# Patient Record
Sex: Male | Born: 2000 | Race: White | Hispanic: Yes | Marital: Single | Smoking: Never smoker
Health system: Southern US, Community
[De-identification: ages and names within clinical notes are randomized; demographics above are authoritative.]

## PROBLEM LIST (undated history)

## (undated) ENCOUNTER — Ambulatory Visit (HOSPITAL_COMMUNITY): Admission: EM | Source: Home / Self Care

## (undated) DIAGNOSIS — J45909 Unspecified asthma, uncomplicated: Secondary | ICD-10-CM

## (undated) HISTORY — PX: HAND SURGERY: SHX662

---
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
## 2020-05-09 ENCOUNTER — Other Ambulatory Visit: Payer: Self-pay

## 2020-05-09 ENCOUNTER — Ambulatory Visit (HOSPITAL_COMMUNITY)
Admission: EM | Admit: 2020-05-09 | Discharge: 2020-05-09 | Disposition: A | Discharge status: Home / Self Care | Payer: Self-pay | Attending: Internal Medicine | Admitting: Internal Medicine

## 2020-05-09 ENCOUNTER — Encounter (HOSPITAL_COMMUNITY): Payer: Self-pay | Admitting: *Deleted

## 2020-05-09 DIAGNOSIS — L02413 Cutaneous abscess of right upper limb: Secondary | ICD-10-CM

## 2020-05-09 MED ORDER — IBUPROFEN 800 MG PO TABS: 800.0000 mg | ORAL_TABLET | Freq: Three times a day (TID) | ORAL | 0 refills | Status: DC

## 2020-05-09 MED ORDER — DOXYCYCLINE HYCLATE 100 MG PO CAPS: 100.0000 mg | ORAL_CAPSULE | Freq: Two times a day (BID) | ORAL | 0 refills | Status: AC

## 2020-05-09 NOTE — ED Triage Notes (Addendum)
Patient reports noticed bump to right upper outer arm that started 5 days ago. About 4 days ago started to become red and grow, today large amount of drainage noted after shower. Patient has been squeezing the area.   Patient also has a small abscess to the right lower arm that he was able to drain on own, no signs of infection noted.   Patient also with abscess he states to right anterior thigh, states that the area if very small like the one on his forearm. That was noted yesterday.   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

## 2020-05-09 NOTE — ED Provider Notes (Signed)
[facility]    CSN: [id number] Arrival date & time: 05/09/20  1625      History   Chief Complaint Chief Complaint  Patient presents with  . Abscess    HPI Casey David is a 19 y.o. male presenting today for evaluation of abscess.  Patient reports he began to develop a small bump to his right upper arm approximately 5 days ago.  He attempted to pop it as he believed it to be a pimple and since has developed increased redness pain and swelling as well as pustular drainage from this area.  He has developed a smaller similar lesion to his right forearm as well as right thigh.  Denies fevers.  HPI  Past Medical History:  Diagnosis Date  . Asthma     There are no problems to display for this patient.   History reviewed. No pertinent surgical history.     Home Medications    Prior to Admission medications   Medication Sig Start Date End Date Taking? Authorizing Provider  doxycycline (VIBRAMYCIN) 100 MG capsule Take 1 capsule (100 mg total) by mouth 2 (two) times daily for 10 days. 05/09/20 05/19/20  Nisaiah Bechtol C, PA-C  ibuprofen (ADVIL) 800 MG tablet Take 1 tablet (800 mg total) by mouth 3 (three) times daily. 05/09/20   Eryk Beavers, Junius Creamer, PA-C    Family History History reviewed. No pertinent family history.  Social History Social History   Tobacco Use  . Smoking status: Never Smoker  . Smokeless tobacco: Never Used  Substance Use Topics  . Alcohol use: Never  . Drug use: Never     Allergies   Patient has no known allergies.   Review of Systems Review of Systems  Constitutional: Negative for fatigue and fever.  Eyes: Negative for redness, itching and visual disturbance.  Respiratory: Negative for shortness of breath.   Cardiovascular: Negative for chest pain and leg swelling.  Gastrointestinal: Negative for nausea and vomiting.  Musculoskeletal: Negative for arthralgias and myalgias.  Skin: Positive for color change and wound. Negative for rash.   Neurological: Negative for dizziness, syncope, weakness, light-headedness and headaches.     Physical Exam Triage Vital Signs ED Triage Vitals  Enc Vitals Group     BP      Pulse      Resp      Temp      Temp src      SpO2      Weight      Height      Head Circumference      Peak Flow      Pain Score      Pain Loc      Pain Edu?      Excl. in GC?    No data found.  Updated Vital Signs BP 118/78 (BP Location: Left Arm)   Pulse 74   Temp 98.4 F (36.9 C) (Oral)   Resp 16   SpO2 100%   Visual Acuity Right Eye Distance:   Left Eye Distance:   Bilateral Distance:    Right Eye Near:   Left Eye Near:    Bilateral Near:     Physical Exam Vitals and nursing note reviewed.  Constitutional:      Appearance: He is well-developed.     Comments: No acute distress  HENT:     Head: Normocephalic and atraumatic.     Nose: Nose normal.  Eyes:     Conjunctiva/sclera: Conjunctivae normal.  Cardiovascular:  Rate and Rhythm: Normal rate.  Pulmonary:     Effort: Pulmonary effort is normal. No respiratory distress.  Abdominal:     General: There is no distension.  Musculoskeletal:        General: Normal range of motion.     Cervical back: Neck supple.     Comments: Full active range of motion of right shoulder elbow and wrist, radial pulse 2+  Skin:    General: Skin is warm and dry.     Comments: Large 4 cm area of induration with central fluctuance actively draining bloody/pustular drainage  Small 2 cm area of induration without fluctuance noted to right forearm  Neurological:     Mental Status: He is alert and oriented to person, place, and time.      UC Treatments / Results  Labs (all labs ordered are listed, but only abnormal results are displayed) Labs Reviewed - No data to display  EKG   Radiology No results found.  Procedures Procedures (including critical care time)  Medications Ordered in UC Medications - No data to display  Initial  Impression / Assessment and Plan / UC Course  I have reviewed the triage vital signs and the nursing notes.  Pertinent labs & imaging results that were available during my care of the patient were reviewed by me and considered in my medical decision making (see chart for details).     Abscess to left upper arm actively draining, deferring any further I&D at this time, initiating on doxycycline, warm compresses and anti-inflammatories.  Monitor for gradual resolution.  Other abscesses mainly small and indurated without fluctuance, deferring further I&D of these other abscesses.  Discussed strict return precautions. Patient verbalized understanding and is agreeable with plan.  Final Clinical Impressions(s) / UC Diagnoses   Final diagnoses:  Abscess of right upper arm and forearm     Discharge Instructions     Please begin doxycycline for 10 days  Apply warm compresses/hot rags to area with massage to express further drainage especially the first 24-48 hours  Use anti-inflammatories for pain/swelling. You may take up to 800 mg Ibuprofen every 8 hours with food. You may supplement Ibuprofen with Tylenol [phone] mg every 8 hours.    Return if symptoms returning or not improving     ED Prescriptions    Medication Sig Dispense Auth. Provider   doxycycline (VIBRAMYCIN) 100 MG capsule Take 1 capsule (100 mg total) by mouth 2 (two) times daily for 10 days. 20 capsule Hellon Vaccarella C, PA-C   ibuprofen (ADVIL) 800 MG tablet Take 1 tablet (800 mg total) by mouth 3 (three) times daily. 21 tablet Davonta Stroot, Fowler C, PA-C     PDMP not reviewed this encounter.   Lew Dawes, [place] 05/09/20 1713

## 2020-05-09 NOTE — Discharge Instructions (Signed)
Please begin doxycycline for 10 days ° °Apply warm compresses/hot rags to area with massage to express further drainage especially the first 24-48 hours ° °Use anti-inflammatories for pain/swelling. You may take up to 800 mg Ibuprofen every 8 hours with food. You may supplement Ibuprofen with Tylenol 500-1000 mg every 8 hours.  ° °Return if symptoms returning or not improving ° °

## 2020-11-01 ENCOUNTER — Emergency Department (HOSPITAL_COMMUNITY): Payer: Medicaid Other

## 2020-11-01 ENCOUNTER — Emergency Department (HOSPITAL_COMMUNITY)
Admission: EM | Admit: 2020-11-01 | Discharge: 2020-11-02 | Disposition: A | Discharge status: Home / Self Care | Payer: Medicaid Other | Attending: Emergency Medicine | Admitting: Emergency Medicine

## 2020-11-01 DIAGNOSIS — S5292XA Unspecified fracture of left forearm, initial encounter for closed fracture: Secondary | ICD-10-CM | POA: Insufficient documentation

## 2020-11-01 DIAGNOSIS — Z23 Encounter for immunization: Secondary | ICD-10-CM | POA: Diagnosis not present

## 2020-11-01 DIAGNOSIS — S81812A Laceration without foreign body, left lower leg, initial encounter: Secondary | ICD-10-CM | POA: Diagnosis not present

## 2020-11-01 DIAGNOSIS — M545 Low back pain, unspecified: Secondary | ICD-10-CM | POA: Insufficient documentation

## 2020-11-01 DIAGNOSIS — Y92410 Unspecified street and highway as the place of occurrence of the external cause: Secondary | ICD-10-CM | POA: Diagnosis not present

## 2020-11-01 DIAGNOSIS — T07XXXA Unspecified multiple injuries, initial encounter: Secondary | ICD-10-CM

## 2020-11-01 DIAGNOSIS — T1490XA Injury, unspecified, initial encounter: Secondary | ICD-10-CM

## 2020-11-01 DIAGNOSIS — S2091XA Abrasion of unspecified parts of thorax, initial encounter: Secondary | ICD-10-CM | POA: Insufficient documentation

## 2020-11-01 DIAGNOSIS — S62654A Nondisplaced fracture of medial phalanx of right ring finger, initial encounter for closed fracture: Secondary | ICD-10-CM | POA: Diagnosis not present

## 2020-11-01 DIAGNOSIS — S0181XA Laceration without foreign body of other part of head, initial encounter: Secondary | ICD-10-CM | POA: Insufficient documentation

## 2020-11-01 DIAGNOSIS — J45909 Unspecified asthma, uncomplicated: Secondary | ICD-10-CM | POA: Diagnosis not present

## 2020-11-01 DIAGNOSIS — M542 Cervicalgia: Secondary | ICD-10-CM | POA: Diagnosis not present

## 2020-11-01 DIAGNOSIS — S60511A Abrasion of right hand, initial encounter: Secondary | ICD-10-CM | POA: Insufficient documentation

## 2020-11-01 DIAGNOSIS — S30811A Abrasion of abdominal wall, initial encounter: Secondary | ICD-10-CM | POA: Diagnosis not present

## 2020-11-01 DIAGNOSIS — Z20822 Contact with and (suspected) exposure to covid-19: Secondary | ICD-10-CM | POA: Insufficient documentation

## 2020-11-01 DIAGNOSIS — S59912A Unspecified injury of left forearm, initial encounter: Secondary | ICD-10-CM | POA: Diagnosis present

## 2020-11-01 LAB — I-STAT CHEM 8, ED
BUN: 23 mg/dL — ABNORMAL HIGH (ref 6–20)
Calcium, Ion: 1.16 mmol/L (ref 1.15–1.40)
Chloride: 100 mmol/L (ref 98–111)
Creatinine, Ser: 1.3 mg/dL — ABNORMAL HIGH (ref 0.61–1.24)
Glucose, Bld: 129 mg/dL — ABNORMAL HIGH (ref 70–99)
HCT: 45 % (ref 39.0–52.0)
Hemoglobin: 15.3 g/dL (ref 13.0–17.0)
Potassium: 4 mmol/L (ref 3.5–5.1)
Sodium: 140 mmol/L (ref 135–145)
TCO2: 29 mmol/L (ref 22–32)

## 2020-11-01 LAB — CBC
HCT: 43.6 % (ref 39.0–52.0)
Hemoglobin: 14.9 g/dL (ref 13.0–17.0)
MCH: 29.7 pg (ref 26.0–34.0)
MCHC: 34.2 g/dL (ref 30.0–36.0)
MCV: 87 fL (ref 80.0–100.0)
Platelets: 273 10*3/uL (ref 150–400)
RBC: 5.01 MIL/uL (ref 4.22–5.81)
RDW: 12.5 % (ref 11.5–15.5)
WBC: 15.1 10*3/uL — ABNORMAL HIGH (ref 4.0–10.5)
nRBC: 0 % (ref 0.0–0.2)

## 2020-11-01 LAB — PROTIME-INR
INR: 1.1 (ref 0.8–1.2)
Prothrombin Time: 13.3 seconds (ref 11.4–15.2)

## 2020-11-01 LAB — SAMPLE TO BLOOD BANK

## 2020-11-01 MED ORDER — MORPHINE SULFATE (PF) 4 MG/ML IV SOLN
4.0000 mg | Freq: Once | INTRAVENOUS | Status: AC
  Administered 2020-11-01: 4 mg via INTRAVENOUS
  Filled 2020-11-01: qty 1

## 2020-11-01 MED ORDER — ONDANSETRON HCL 4 MG/2ML IJ SOLN
4.0000 mg | Freq: Once | INTRAMUSCULAR | Status: AC
  Administered 2020-11-01: 4 mg via INTRAVENOUS
  Filled 2020-11-01: qty 2

## 2020-11-01 MED ORDER — TETANUS-DIPHTH-ACELL PERTUSSIS 5-2.5-18.5 LF-MCG/0.5 IM SUSY
0.5000 mL | PREFILLED_SYRINGE | Freq: Once | INTRAMUSCULAR | Status: AC
  Administered 2020-11-01: 0.5 mL via INTRAMUSCULAR
  Filled 2020-11-01: qty 0.5
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
## 2020-11-01 NOTE — ED Notes (Signed)
Pt mother made aware that pt is stable and being seen her in the ED per pt request.
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
## 2020-11-02 LAB — COMPREHENSIVE METABOLIC PANEL
ALT: 130 U/L — ABNORMAL HIGH (ref 0–44)
AST: 132 U/L — ABNORMAL HIGH (ref 15–41)
Albumin: 3.8 g/dL (ref 3.5–5.0)
Alkaline Phosphatase: 84 U/L (ref 38–126)
Anion gap: 10 (ref 5–15)
BUN: 17 mg/dL (ref 6–20)
CO2: 25 mmol/L (ref 22–32)
Calcium: 9.1 mg/dL (ref 8.9–10.3)
Chloride: 101 mmol/L (ref 98–111)
Creatinine, Ser: 1.27 mg/dL — ABNORMAL HIGH (ref 0.61–1.24)
GFR, Estimated: >60 mL/min (ref >60)
Glucose, Bld: 127 mg/dL — ABNORMAL HIGH (ref 70–99)
Potassium: 3.9 mmol/L (ref 3.5–5.1)
Sodium: 136 mmol/L (ref 135–145)
Total Bilirubin: 0.8 mg/dL (ref 0.3–1.2)
Total Protein: 7.5 g/dL (ref 6.5–8.1)

## 2020-11-02 LAB — ETHANOL: Alcohol, Ethyl (B): <10 mg/dL (ref <10)

## 2020-11-02 LAB — RESP PANEL BY RT-PCR (FLU A&B, COVID) ARPGX2
Influenza A by PCR: NEGATIVE
Influenza B by PCR: NEGATIVE
SARS Coronavirus 2 by RT PCR: NEGATIVE

## 2020-11-02 LAB — LACTIC ACID, PLASMA: Lactic Acid, Venous: 2.4 mmol/L (ref 0.5–1.9)

## 2020-11-02 MED ORDER — OXYCODONE-ACETAMINOPHEN 5-325 MG PO TABS
1.0000 | ORAL_TABLET | ORAL | 0 refills | Status: DC | PRN
Start: 2020-11-02 — End: 2020-11-06

## 2020-11-02 MED ORDER — OXYCODONE-ACETAMINOPHEN 5-325 MG PO TABS
1.0000 | ORAL_TABLET | Freq: Once | ORAL | Status: AC
  Administered 2020-11-02: 1 via ORAL
  Filled 2020-11-02: qty 1

## 2020-11-02 MED ORDER — IOHEXOL 300 MG/ML  SOLN
100.0000 mL | Freq: Once | INTRAMUSCULAR | Status: AC | PRN
  Administered 2020-11-02: 100 mL via INTRAVENOUS

## 2020-11-02 MED ORDER — HYDROMORPHONE HCL 1 MG/ML IJ SOLN
1.0000 mg | Freq: Once | INTRAMUSCULAR | Status: AC
  Administered 2020-11-02: 1 mg via INTRAVENOUS
  Filled 2020-11-02: qty 1

## 2020-11-02 MED ORDER — LIDOCAINE-EPINEPHRINE 1 %-1:100000 IJ SOLN
20.0000 mL | Freq: Once | INTRAMUSCULAR | Status: AC
  Administered 2020-11-02: 20 mL
  Filled 2020-11-02: qty 1

## 2020-11-02 NOTE — Progress Notes (Signed)
Orthopedic Tech Progress Note Patient Details:  Casey David 12-Feb-2001 [id number]  Ortho Devices Type of Ortho Device: Post (long arm) splint Ortho Device/Splint Location: lue. applied at drs request. Ortho Device/Splint Interventions: Ordered,Application,Adjustment   Post Interventions Patient Tolerated: Well Instructions Provided: Care of device,Adjustment of device   Trinna Post 11/02/2020, 12:28 AM

## 2020-11-02 NOTE — Progress Notes (Signed)
Orthopedic Tech Progress Note Patient Details:  Casey David 2001-07-23 [id number]  Ortho Devices Type of Ortho Device: Sling immobilizer,Post (long arm) splint Ortho Device/Splint Location: lue. I applied a new splint post stitches to replace bloody old splint. Ortho Device/Splint Interventions: Ordered,Application,Adjustment   Post Interventions Patient Tolerated: Well Instructions Provided: Care of device,Adjustment of device   Trinna Post 11/02/2020, 6:09 AM

## 2020-11-02 NOTE — ED Provider Notes (Signed)
Please see Dr. Winnifred Friar note for full H&P.   Marland Kitchen.Laceration Repair  Date/Time: 11/02/2020 5:23 AM Performed by: Cherly Anderson, PA-C Authorized by: Cherly Anderson, PA-C   Consent:    Consent obtained:  Verbal   Consent given by:  Patient   Risks, benefits, and alternatives were discussed: yes     Risks discussed:  Infection, nerve damage, need for additional repair, pain, poor cosmetic result, poor wound healing, vascular damage, tendon damage and retained foreign body   Alternatives discussed:  No treatment Anesthesia:    Anesthesia method:  Local infiltration   Local anesthetic:  Lidocaine 1% WITH epi Laceration details:    Location:  Leg   Leg location:  L lower leg   Length (cm):  10   Depth (mm):  5 Pre-procedure details:    Preparation:  Imaging obtained to evaluate for foreign bodies and patient was prepped and draped in usual sterile fashion Exploration:    Hemostasis achieved with:  Direct pressure Treatment:    Area cleansed with:  Povidone-iodine   Amount of cleaning:  Standard   Irrigation solution:  Sterile water   Irrigation volume:  1L   Irrigation method:  Pressure wash   Layers/structures repaired:  Deep subcutaneous Deep subcutaneous:    Suture size:  3-0   Suture material:  Vicryl   Suture technique:  Simple interrupted   Number of sutures:  3 (Placed by attending Dr. Wilkie Aye) Skin repair:    Repair method:  Sutures   Suture size:  3-0   Suture material:  Nylon   Suture technique:  Horizontal mattress   Number of sutures:  6 Repair type:    Repair type:  Intermediate Post-procedure details:    Procedure completion:  Tolerated well, no immediate complications        Cherly Anderson, PA-C 11/02/20 0524    Shon Baton, MD 11/03/20 0100

## 2020-11-02 NOTE — ED Notes (Signed)
Horton, MD made aware of critical lactic acid of 2.4.

## 2020-11-02 NOTE — ED Notes (Signed)
Sling applied to L arm,  finger splint applied to R hand ring finger and dressing applied to L lower leg. Patient assisted into paper scrubs. Patient ambulated around nurses station and to bathroom with steady gait. Provided patient with a soda. Sitting in room at this time.

## 2020-11-02 NOTE — ED Notes (Signed)
Patient verbalizes understanding of discharge instructions. Prescriptions and follow-up care reviewed. Opportunity for questioning and answers were provided. Armband removed by staff, pt discharged from ED via wheelchair with family member.
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
## 2020-11-06 ENCOUNTER — Ambulatory Visit (HOSPITAL_COMMUNITY): Payer: Medicaid Other

## 2020-11-06 ENCOUNTER — Ambulatory Visit (HOSPITAL_COMMUNITY): Payer: Medicaid Other | Admitting: Anesthesiology

## 2020-11-06 ENCOUNTER — Ambulatory Visit (HOSPITAL_COMMUNITY)
Admission: RE | Admit: 2020-11-06 | Discharge: 2020-11-06 | Disposition: A | Discharge status: Home / Self Care | Payer: Medicaid Other | Attending: Orthopaedic Surgery | Admitting: Orthopaedic Surgery

## 2020-11-06 ENCOUNTER — Other Ambulatory Visit: Payer: Self-pay

## 2020-11-06 ENCOUNTER — Encounter (HOSPITAL_COMMUNITY)
Admission: RE | Disposition: A | Discharge status: Home / Self Care | Payer: Self-pay | Source: Home / Self Care | Attending: Orthopaedic Surgery

## 2020-11-06 ENCOUNTER — Encounter (HOSPITAL_COMMUNITY): Payer: Self-pay | Admitting: Orthopaedic Surgery

## 2020-11-06 DIAGNOSIS — S52322A Displaced transverse fracture of shaft of left radius, initial encounter for closed fracture: Secondary | ICD-10-CM | POA: Insufficient documentation

## 2020-11-06 DIAGNOSIS — S62654A Nondisplaced fracture of medial phalanx of right ring finger, initial encounter for closed fracture: Secondary | ICD-10-CM | POA: Diagnosis not present

## 2020-11-06 DIAGNOSIS — S52222A Displaced transverse fracture of shaft of left ulna, initial encounter for closed fracture: Secondary | ICD-10-CM | POA: Diagnosis not present

## 2020-11-06 HISTORY — PX: OPEN REDUCTION INTERNAL FIXATION (ORIF) DISTAL RADIAL FRACTURE: SHX5989

## 2020-11-06 SURGERY — OPEN REDUCTION INTERNAL FIXATION (ORIF) DISTAL RADIUS FRACTURE
Anesthesia: General | Laterality: Left

## 2020-11-06 MED ORDER — MIDAZOLAM HCL 2 MG/2ML IJ SOLN
INTRAMUSCULAR | Status: DC | PRN
  Administered 2020-11-06: 2 mg via INTRAVENOUS

## 2020-11-06 MED ORDER — OXYCODONE-ACETAMINOPHEN 5-325 MG PO TABS
ORAL_TABLET | ORAL | Status: AC
  Filled 2020-11-06: qty 2

## 2020-11-06 MED ORDER — OXYCODONE-ACETAMINOPHEN 5-325 MG PO TABS
1.0000 | ORAL_TABLET | ORAL | Status: DC | PRN
  Administered 2020-11-06: 2 via ORAL

## 2020-11-06 MED ORDER — OXYCODONE HCL 5 MG/5ML PO SOLN: 5.0000 mg | Freq: Once | ORAL | Status: DC | PRN

## 2020-11-06 MED ORDER — ACETAMINOPHEN 500 MG PO TABS
1000.0000 mg | ORAL_TABLET | Freq: Once | ORAL | Status: AC
  Administered 2020-11-06: 1000 mg via ORAL

## 2020-11-06 MED ORDER — ORAL CARE MOUTH RINSE: 15.0000 mL | Freq: Once | OROMUCOSAL | Status: AC

## 2020-11-06 MED ORDER — ONDANSETRON HCL 4 MG/2ML IJ SOLN
INTRAMUSCULAR | Status: DC | PRN
  Administered 2020-11-06: 4 mg via INTRAVENOUS

## 2020-11-06 MED ORDER — POVIDONE-IODINE 10 % EX SWAB
2.0000 "application " | Freq: Once | CUTANEOUS | Status: AC
  Administered 2020-11-06: 2 via TOPICAL

## 2020-11-06 MED ORDER — AMISULPRIDE (ANTIEMETIC) 5 MG/2ML IV SOLN: 10.0000 mg | Freq: Once | INTRAVENOUS | Status: DC | PRN

## 2020-11-06 MED ORDER — ACETAMINOPHEN 500 MG PO TABS
ORAL_TABLET | ORAL | Status: AC
  Filled 2020-11-06: qty 2

## 2020-11-06 MED ORDER — CHLORHEXIDINE GLUCONATE 4 % EX LIQD: 60.0000 mL | Freq: Once | CUTANEOUS | Status: DC

## 2020-11-06 MED ORDER — LIDOCAINE 2% (20 MG/ML) 5 ML SYRINGE
INTRAMUSCULAR | Status: DC | PRN
  Administered 2020-11-06: 30 mg via INTRAVENOUS

## 2020-11-06 MED ORDER — CEFAZOLIN SODIUM-DEXTROSE 2-4 GM/100ML-% IV SOLN
2.0000 g | INTRAVENOUS | Status: DC
  Filled 2020-11-06: qty 100

## 2020-11-06 MED ORDER — BUPIVACAINE HCL (PF) 0.25 % IJ SOLN
INTRAMUSCULAR | Status: AC
  Filled 2020-11-06: qty 30

## 2020-11-06 MED ORDER — FENTANYL CITRATE (PF) 100 MCG/2ML IJ SOLN
25.0000 ug | INTRAMUSCULAR | Status: DC | PRN
  Administered 2020-11-06 (×2): 50 ug via INTRAVENOUS

## 2020-11-06 MED ORDER — CHLORHEXIDINE GLUCONATE 0.12 % MT SOLN
15.0000 mL | Freq: Once | OROMUCOSAL | Status: AC
  Administered 2020-11-06: 15 mL via OROMUCOSAL
  Filled 2020-11-06: qty 15

## 2020-11-06 MED ORDER — FENTANYL CITRATE (PF) 250 MCG/5ML IJ SOLN
INTRAMUSCULAR | Status: AC
  Filled 2020-11-06: qty 5

## 2020-11-06 MED ORDER — MIDAZOLAM HCL 2 MG/2ML IJ SOLN
INTRAMUSCULAR | Status: AC
  Filled 2020-11-06: qty 2

## 2020-11-06 MED ORDER — FENTANYL CITRATE (PF) 100 MCG/2ML IJ SOLN
INTRAMUSCULAR | Status: AC
  Filled 2020-11-06: qty 2

## 2020-11-06 MED ORDER — PROPOFOL 10 MG/ML IV BOLUS
INTRAVENOUS | Status: DC | PRN
  Administered 2020-11-06: 200 mg via INTRAVENOUS

## 2020-11-06 MED ORDER — BUPIVACAINE HCL (PF) 0.25 % IJ SOLN
INTRAMUSCULAR | Status: DC | PRN
  Administered 2020-11-06: 20 mL

## 2020-11-06 MED ORDER — 0.9 % SODIUM CHLORIDE (POUR BTL) OPTIME
TOPICAL | Status: DC | PRN
  Administered 2020-11-06: 1000 mL

## 2020-11-06 MED ORDER — OXYCODONE HCL 5 MG PO TABS: 5.0000 mg | ORAL_TABLET | Freq: Once | ORAL | Status: DC | PRN

## 2020-11-06 MED ORDER — PROMETHAZINE HCL 25 MG/ML IJ SOLN: 6.2500 mg | INTRAMUSCULAR | Status: DC | PRN

## 2020-11-06 MED ORDER — FENTANYL CITRATE (PF) 250 MCG/5ML IJ SOLN
INTRAMUSCULAR | Status: DC | PRN
  Administered 2020-11-06 (×3): 50 ug via INTRAVENOUS

## 2020-11-06 MED ORDER — OXYCODONE-ACETAMINOPHEN 5-325 MG PO TABS: 1.0000 | ORAL_TABLET | ORAL | 0 refills | Status: DC | PRN

## 2020-11-06 MED ORDER — CEFAZOLIN SODIUM-DEXTROSE 2-3 GM-%(50ML) IV SOLR
INTRAVENOUS | Status: DC | PRN
  Administered 2020-11-06: 2 g via INTRAVENOUS

## 2020-11-06 MED ORDER — LACTATED RINGERS IV SOLN: INTRAVENOUS | Status: DC

## 2020-11-06 SURGICAL SUPPLY — 64 items
ALCOHOL 70% 16 OZ (MISCELLANEOUS) IMPLANT
APL PRP STRL LF DISP 70% ISPRP (MISCELLANEOUS) ×1
BIT DRILL 2.3 QUICK RELEASE (BIT) IMPLANT
BIT DRILL 2.8X5 QR DISP (BIT) ×1 IMPLANT
BLADE CLIPPER SURG (BLADE) IMPLANT
BLADE SURG 15 STRL LF DISP TIS (BLADE) IMPLANT
BLADE SURG 15 STRL SS (BLADE) ×4
BNDG CMPR 9X4 STRL LF SNTH (GAUZE/BANDAGES/DRESSINGS) ×1
BNDG ELASTIC 3X5.8 VLCR STR LF (GAUZE/BANDAGES/DRESSINGS) ×3 IMPLANT
BNDG ELASTIC 4X5.8 VLCR STR LF (GAUZE/BANDAGES/DRESSINGS) ×1 IMPLANT
BNDG ESMARK 4X9 LF (GAUZE/BANDAGES/DRESSINGS) ×2 IMPLANT
BNDG GAUZE ELAST 4 BULKY (GAUZE/BANDAGES/DRESSINGS) ×4 IMPLANT
CHLORAPREP W/TINT 26 (MISCELLANEOUS) ×2 IMPLANT
CLSR STERI-STRIP ANTIMIC 1/2X4 (GAUZE/BANDAGES/DRESSINGS) ×1 IMPLANT
CORD BIPOLAR FORCEPS 12FT (ELECTRODE) ×2 IMPLANT
COVER SURGICAL LIGHT HANDLE (MISCELLANEOUS) ×2 IMPLANT
COVER WAND RF STERILE (DRAPES) ×1 IMPLANT
CUFF TOURN SGL QUICK 18X4 (TOURNIQUET CUFF) ×2 IMPLANT
CUFF TOURN SGL QUICK 24 (TOURNIQUET CUFF)
CUFF TRNQT CYL 24X4X16.5-23 (TOURNIQUET CUFF) IMPLANT
DRAPE OEC MINIVIEW 54X84 (DRAPES) ×1 IMPLANT
DRAPE U-SHAPE 47X51 STRL (DRAPES) ×2 IMPLANT
DRILL 2.3 QUICK RELEASE (BIT) ×2
DRSG XEROFORM 1X8 (GAUZE/BANDAGES/DRESSINGS) ×1 IMPLANT
GAUZE SPONGE 4X4 12PLY STRL (GAUZE/BANDAGES/DRESSINGS) ×2 IMPLANT
GAUZE XEROFORM 5X9 LF (GAUZE/BANDAGES/DRESSINGS) ×1 IMPLANT
GLOVE SRG 8 PF TXTR STRL LF DI (GLOVE) ×1 IMPLANT
GLOVE SURG SYN 7.5  E (GLOVE) ×2
GLOVE SURG SYN 7.5 E (GLOVE) ×1 IMPLANT
GLOVE SURG SYN 7.5 PF PI (GLOVE) ×1 IMPLANT
GLOVE SURG UNDER POLY LF SZ8 (GLOVE) ×2
GOWN STRL REUS W/ TWL LRG LVL3 (GOWN DISPOSABLE) ×2 IMPLANT
GOWN STRL REUS W/TWL LRG LVL3 (GOWN DISPOSABLE) ×4
GUIDEWIRE ORTHO .059X5 (WIRE) ×1 IMPLANT
KIT BASIN OR (CUSTOM PROCEDURE TRAY) ×2 IMPLANT
KIT TURNOVER KIT B (KITS) ×2 IMPLANT
NDL HYPO 25GX1X1/2 BEV (NEEDLE) IMPLANT
NEEDLE HYPO 25GX1X1/2 BEV (NEEDLE) ×2 IMPLANT
NS IRRIG 1000ML POUR BTL (IV SOLUTION) ×2 IMPLANT
PACK ORTHO EXTREMITY (CUSTOM PROCEDURE TRAY) ×2 IMPLANT
PAD ARMBOARD 7.5X6 YLW CONV (MISCELLANEOUS) ×4 IMPLANT
PAD CAST 3X4 CTTN HI CHSV (CAST SUPPLIES) IMPLANT
PAD CAST 4YDX4 CTTN HI CHSV (CAST SUPPLIES) ×1 IMPLANT
PADDING CAST COTTON 3X4 STRL (CAST SUPPLIES)
PADDING CAST COTTON 4X4 STRL (CAST SUPPLIES) ×4
PLATE 8 HOLE RADIUS (Plate) ×1 IMPLANT
PLATE ULNA MIDSHAFT 6 HOLE (Plate) ×1 IMPLANT
SCREW BONE NL 3.0X16 HEXA (Screw) IMPLANT
SCREW BONE NON-LOCK 3.0X16 (Screw) ×2 IMPLANT
SCREW HEXALOBE NON-LOCK 3.5X14 (Screw) ×14 IMPLANT
SCREW NONLOCK HEX 3.5X12 (Screw) ×1 IMPLANT
SPLINT FIBERGLASS 3X12 (CAST SUPPLIES) ×1 IMPLANT
SPLINT FIBERGLASS 3X35 (CAST SUPPLIES) ×1 IMPLANT
SPONGE LAP 4X18 RFD (DISPOSABLE) IMPLANT
SUT PROLENE 4 0 PS 2 18 (SUTURE) ×2 IMPLANT
SUT VIC AB 3-0 PS2 18 (SUTURE) IMPLANT
SUT VIC AB 3-0 SH 27 (SUTURE) ×4
SUT VIC AB 3-0 SH 27X BRD (SUTURE) IMPLANT
SYR CONTROL 10ML LL (SYRINGE) ×1 IMPLANT
TOWEL GREEN STERILE (TOWEL DISPOSABLE) ×2 IMPLANT
TOWEL GREEN STERILE FF (TOWEL DISPOSABLE) ×2 IMPLANT
TUBE CONNECTING 12X1/4 (SUCTIONS) ×2 IMPLANT
UNDERPAD 30X36 HEAVY ABSORB (UNDERPADS AND DIAPERS) ×2 IMPLANT
WATER STERILE IRR 1000ML POUR (IV SOLUTION) ×2 IMPLANT
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
## 2020-11-06 NOTE — Anesthesia Preprocedure Evaluation (Addendum)
Anesthesia Evaluation  Patient identified by MRN, date of birth, ID band Patient awake    Reviewed: Allergy & Precautions, H&P , NPO status , Patient's Chart, lab work & pertinent test results  Airway Mallampati: II  TM Distance: >3 FB Neck ROM: Full    Dental no notable dental hx.    Pulmonary asthma ,    Pulmonary exam normal breath sounds clear to auscultation       Cardiovascular negative cardio ROS Normal cardiovascular exam Rhythm:Regular Rate:Normal     Neuro/Psych negative neurological ROS  negative psych ROS   GI/Hepatic negative GI ROS, Neg liver ROS,   Endo/Other  negative endocrine ROS  Renal/GU negative Renal ROS  negative genitourinary   Musculoskeletal negative musculoskeletal ROS (+)   Abdominal   Peds negative pediatric ROS (+)  Hematology negative hematology ROS (+)   Anesthesia Other Findings   Reproductive/Obstetrics negative OB ROS                             Anesthesia Physical Anesthesia Plan  ASA: II  Anesthesia Plan: General   Post-op Pain Management:    Induction: Intravenous  PONV Risk Score and Plan: 2 and Ondansetron and Treatment may vary due to age or medical condition  Airway Management Planned: LMA  Additional Equipment: None  Intra-op Plan:   Post-operative Plan: Extubation in OR  Informed Consent: I have reviewed the patients History and Physical, chart, labs and discussed the procedure including the risks, benefits and alternatives for the proposed anesthesia with the patient or authorized representative who has indicated his/her understanding and acceptance.     Dental advisory given  Plan Discussed with: CRNA, Anesthesiologist and Surgeon  Anesthesia Plan Comments:        Anesthesia Quick Evaluation

## 2020-11-06 NOTE — Anesthesia Postprocedure Evaluation (Signed)
Anesthesia Post Note  Patient: Casey David  Procedure(s) Performed: OPEN REDUCTION INTERNAL FIXATION (ORIF) DISTAL RADIAL AND ULNAR FRACTURE, NON OPERATIVE TREATMENT TO RIGHT NONDISPLACED FOURTH MIDDLE PHALANX FRACTURE (Left )     Patient location during evaluation: PACU Anesthesia Type: General Level of consciousness: awake Pain management: pain level controlled Vital Signs Assessment: post-procedure vital signs reviewed and stable Respiratory status: spontaneous breathing and respiratory function stable Cardiovascular status: stable Postop Assessment: no apparent nausea or vomiting Anesthetic complications: no   No complications documented.  Last Vitals:  Vitals:   11/06/20 1304 11/06/20 1310  BP:  (!) 147/102  Pulse: 95 92  Resp: 14   Temp:  (!) 36.2 C  SpO2: 96% 98%    Last Pain:  Vitals:   11/06/20 1310  TempSrc:   PainSc: 5                  Candra R Gavynn Duvall

## 2020-11-06 NOTE — Transfer of Care (Signed)
Immediate Anesthesia Transfer of Care Note  Patient: Casey David  Procedure(s) Performed: OPEN REDUCTION INTERNAL FIXATION (ORIF) DISTAL RADIAL AND ULNAR FRACTURE, NON OPERATIVE TREATMENT TO RIGHT NONDISPLACED FOURTH MIDDLE PHALANX FRACTURE (Left )  Patient Location: PACU  Anesthesia Type:General  Level of Consciousness: drowsy and patient cooperative  Airway & Oxygen Therapy: Patient Spontanous Breathing  Post-op Assessment: Report given to RN and Post -op Vital signs reviewed and stable  Post vital signs: Reviewed and stable  Last Vitals:  Vitals Value Taken Time  BP    Temp    Pulse 94 11/06/20 1208  Resp 22 11/06/20 1207  SpO2 96 % 11/06/20 1208  Vitals shown include unvalidated device data.  Last Pain:  Vitals:   11/06/20 0746  TempSrc:   PainSc: 7       Patients Stated Pain Goal: 3 (11/06/20 0746)  Complications: No complications documented.

## 2020-11-06 NOTE — Discharge Instructions (Signed)
Discharge Instructions  - Keep dressings in place. Do not remove them. - The dressings must stay dry - Take all medication as prescribed. Transition to over the counter pain medication as your pain improves - Keep the hand elevated over the next 48-72 hours to help with pain and swelling - Move all digits not restricted by the dressings regularly to prevent stiffness - Please call to schedule a follow up appointment with Dr. Taelynn Mcelhannon and therapy at [phone] for 10-14 days following surgery - Your pain medication have been sent digitally to your pharmacy  
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
## 2020-11-06 NOTE — Anesthesia Procedure Notes (Signed)
Procedure Name: LMA Insertion Date/Time: 11/06/2020 9:55 AM Performed by: Gwenyth Allegra, CRNA Pre-anesthesia Checklist: Patient identified, Emergency Drugs available, Suction available, Patient being monitored and Timeout performed Patient Re-evaluated:Patient Re-evaluated prior to induction Oxygen Delivery Method: Circle system utilized Preoxygenation: Pre-oxygenation with 100% oxygen Induction Type: IV induction LMA Size: 4.0 Number of attempts: 1 Placement Confirmation: positive ETCO2 and breath sounds checked- equal and bilateral Tube secured with: Tape Dental Injury: Teeth and Oropharynx as per pre-operative assessment

## 2020-11-07 ENCOUNTER — Encounter (HOSPITAL_COMMUNITY): Payer: Self-pay | Admitting: Orthopaedic Surgery

## 2021-02-06 ENCOUNTER — Other Ambulatory Visit: Payer: Self-pay

## 2021-02-06 ENCOUNTER — Encounter: Payer: Self-pay | Admitting: Family Medicine

## 2021-02-06 ENCOUNTER — Ambulatory Visit (INDEPENDENT_AMBULATORY_CARE_PROVIDER_SITE_OTHER): Payer: 59 | Admitting: Family Medicine

## 2021-02-06 VITALS — BP 110/68 | HR 55 | Temp 98.0°F | Ht 67.0 in | Wt 207.4 lb

## 2021-02-06 DIAGNOSIS — B351 Tinea unguium: Secondary | ICD-10-CM | POA: Diagnosis not present

## 2021-02-06 DIAGNOSIS — S59911A Unspecified injury of right forearm, initial encounter: Secondary | ICD-10-CM

## 2021-02-06 MED ORDER — TERBINAFINE HCL 250 MG PO TABS: 250.0000 mg | ORAL_TABLET | Freq: Every day | ORAL | 2 refills | Status: DC

## 2021-02-06 NOTE — Patient Instructions (Signed)
Give us 2-3 business days to get the results of your labs back.   If you do not hear anything about your referral in the next 1-2 weeks, call our office and ask for an update.  Let us know if you need anything. 
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
## 2021-02-09 LAB — HEPATIC FUNCTION PANEL
ALT: 61 U/L — ABNORMAL HIGH (ref 0–53)
AST: 26 U/L (ref 0–37)
Albumin: 4 g/dL (ref 3.5–5.2)
Alkaline Phosphatase: 71 U/L (ref 39–117)
Bilirubin, Direct: 0.1 mg/dL (ref 0.0–0.3)
Total Bilirubin: 0.3 mg/dL (ref 0.2–1.2)
Total Protein: 7.3 g/dL (ref 6.0–8.3)

## 2021-02-20 ENCOUNTER — Other Ambulatory Visit: Payer: Self-pay

## 2021-02-20 ENCOUNTER — Ambulatory Visit: Payer: 59 | Attending: Family Medicine | Admitting: Physical Therapy

## 2021-02-20 ENCOUNTER — Telehealth: Payer: Self-pay | Admitting: Physical Therapy

## 2021-02-20 DIAGNOSIS — R201 Hypoesthesia of skin: Secondary | ICD-10-CM | POA: Insufficient documentation

## 2021-02-20 DIAGNOSIS — M6281 Muscle weakness (generalized): Secondary | ICD-10-CM | POA: Insufficient documentation
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
## 2021-02-25 NOTE — Telephone Encounter (Signed)
Patient hasnt been seen in clinic in over three months. It sounds like he is progressing well but id prefer to have some radiographs to show healing before I can fully approve lifting. Recommend he call clinic for follow up

## 2021-02-26 ENCOUNTER — Telehealth: Payer: Self-pay | Admitting: Physical Therapy

## 2021-02-26 NOTE — Telephone Encounter (Signed)
Called pt to discuss ortho's recommendations for follow-up radiographs.   Pt verbalizes understanding to call Dr. Hinda Glatter office and schedule a follow-up prior to his next PT visit so therapy can progress his lifting and strengthening safely.   Casey David Casey David, PT, DPT

## 2021-03-23 ENCOUNTER — Encounter: Payer: 59 | Admitting: Family Medicine

## 2021-03-27 ENCOUNTER — Ambulatory Visit: Payer: 59 | Attending: Family Medicine | Admitting: Physical Therapy

## 2021-03-27 ENCOUNTER — Telehealth: Payer: Self-pay | Admitting: Physical Therapy

## 2021-03-27 DIAGNOSIS — M6281 Muscle weakness (generalized): Secondary | ICD-10-CM | POA: Insufficient documentation

## 2021-03-27 DIAGNOSIS — R201 Hypoesthesia of skin: Secondary | ICD-10-CM | POA: Insufficient documentation

## 2021-03-27 NOTE — Telephone Encounter (Signed)
Spoke with pt regarding missed appt today at 8am. He stated he realized he missed it. I discussed calling us if he cannot make his appointment and we can work to cancel/ reschedule that for him. I noted when his next appointment day/ time is which he confirmed.  Samuella Rasool PT, DPT, LAT, ATC  03/27/21  9:10 AM

## 2021-04-03 ENCOUNTER — Other Ambulatory Visit: Payer: Self-pay

## 2021-04-03 ENCOUNTER — Ambulatory Visit: Payer: 59 | Admitting: Physical Therapy

## 2021-04-03 ENCOUNTER — Encounter: Payer: Self-pay | Admitting: Physical Therapy

## 2021-04-03 DIAGNOSIS — M6281 Muscle weakness (generalized): Secondary | ICD-10-CM | POA: Diagnosis not present

## 2021-04-03 DIAGNOSIS — R201 Hypoesthesia of skin: Secondary | ICD-10-CM

## 2021-04-03 NOTE — Patient Instructions (Signed)
Access Code: [email or web address] URL: [email or web address] Date: 04/03/2021 Prepared by: Lulu Riding  Exercises Seated Wrist Flexion with Dumbbell - 1 x daily - 7 x weekly - 3 sets - 10 reps Seated Wrist Extension with Dumbbell - 1 x daily - 7 x weekly - 3 sets - 10 reps Seated Wrist Radial Deviation with Dumbbell - 1 x daily - 7 x weekly - 3 sets - 10 reps Forearm Pronation with Resistance - 1 x daily - 7 x weekly - 3 sets - 10 reps Seated Elbow Flexion with Resistance - 1 x daily - 7 x weekly - 3 sets - 10 reps Standing Single Arm Elbow Flexion with Resistance - 1 x daily - 7 x weekly - 3 sets - 15 reps Elbow Extension with Resistance - 1 x daily - 7 x weekly - 2 sets - 15 reps CLX Shoulder Overhead Press with Hand Closed - 1 x daily - 7 x weekly - 2 sets - 10 reps Wall Push Up - 1 x daily - 7 x weekly - 2 sets - 10 reps Push Up on Table - 1 x daily - 7 x weekly - 2 sets - 10 reps Push Up - 1 x daily - 7 x weekly - 2 sets - 10 reps

## 2021-04-03 NOTE — Therapy (Signed)
[place], [place], [address] Phone: [phone]   Fax:  [phone]  Physical Therapy Treatment  Patient Details  Name: Casey David MRN: [id number] Date of Birth: Jul 05, 2001 Referring Provider (PT): Shelda Pal, [place]   Encounter Date: 04/03/2021   PT End of Session - 04/03/21 0753     Visit Number 2    Number of Visits 6    Date for PT Re-Evaluation 04/24/21    Authorization Type [employer]    PT Start Time 0800    PT Stop Time 0828    PT Time Calculation (min) 28 min    Activity Tolerance Patient tolerated treatment well    Behavior During Therapy [name] for tasks assessed/performed             Past Medical History:  Diagnosis Date   Asthma    as a child, no problems as an adult     Past Surgical History:  Procedure Laterality Date   HAND SURGERY Right    index finger surgery   OPEN REDUCTION INTERNAL FIXATION (ORIF) DISTAL RADIAL FRACTURE Left 11/06/2020   Procedure: OPEN REDUCTION INTERNAL FIXATION (ORIF) DISTAL RADIAL AND ULNAR FRACTURE, NON OPERATIVE TREATMENT TO RIGHT NONDISPLACED FOURTH MIDDLE PHALANX FRACTURE;  Surgeon: Verner Mould, MD;  Location: [facility];  Service: Orthopedics;  Laterality: Left;  needs 90 minutes    There were no vitals filed for this visit.   Subjective Assessment - 04/03/21 0755     Subjective "things are going pretty good, been doing the exercises here and there but limited consistency. continued alterned sensation mostly along were the plates. he reports the MD said he is cleared with no restrictions."    Currently in Pain? No/denies                [facility] PT Assessment - 04/03/21 0001       Assessment   Medical Diagnosis S59.911A (ICD-10-CM) - Injury of right forearm, initial encounter    Referring Provider (PT) Nani Ravens, Crosby Oyster, DO      Strength   Left Wrist Flexion 4+/5    Left Wrist Extension 4+/5    Left Wrist  Radial Deviation 4+/5    Left Wrist Ulnar Deviation 4+/5    Left Hand Grip (lbs) 100.3   [id number]                                  PT Education - 04/03/21 0815     Education Details reviewed HEP and discussed progressiong of strengtheing appropriately, and updated HEP to progress/ engage shoulder, elbow, wrist and hand.    Person(s) Educated Patient    Methods Explanation;Verbal cues;Handout    Comprehension Verbalized understanding;Verbal cues required                 PT Long Term Goals - 04/03/21 0803       PT LONG TERM GOAL #1   Title Pt will be independent with initial HEP    Period Weeks    Status Achieved      PT LONG TERM GOAL #2   Title Pt will demo L grip strength to be at least 90 lbs to be more equal to R      PT LONG TERM GOAL #3   Title Pt will demo L=R wrist strength for return to all leisure and work tasks to [employer]  Period Weeks                   Plan - 04/03/21 1624     Clinical Impression Statement Mr Mccreadie arrived since his evaluation noting he has been doing his exercises and feels much better stating he thinks today can be his last day. He continues to have altered sensation along the medial aspect of the forearm but noted it doesn't impact his function. time taken to review his HEP and proper progressing and provided additional resistance bands. He met all his goals today and is able to maintain and progress his current LOF and will be discharged from PT.    PT Treatment/Interventions ADLs/Self Care Home Management;Cryotherapy;Moist Heat;Ultrasound;Functional mobility training;Therapeutic activities;Therapeutic exercise;Neuromuscular re-education;Patient/family education;Manual techniques;Dry needling;Taping    PT Next Visit Plan D/C             Patient will benefit from skilled therapeutic intervention in order to improve the following deficits and impairments:  Impaired UE functional use, Decreased  knowledge of precautions, Decreased mobility, Decreased strength, Impaired sensation  Visit Diagnosis: Muscle weakness (generalized)  Hypoesthesia of skin     Problem List Patient Active Problem List   Diagnosis Date Noted   Onychomycosis 02/06/2021    [place] 04/03/2021, 8:27 AM  [facility] [address] [place], [place], [address] Phone: [phone]   Fax:  [phone]  Name: Casey David MRN: [id number] Date of Birth: March 09, 2001    PHYSICAL THERAPY DISCHARGE SUMMARY  Visits from Start of Care: 2  Current functional level related to goals / functional outcomes: See goals   Remaining deficits: See assessment   Education / Equipment: HEP,theraband   Patient agrees to discharge. Patient goals were met. Patient is being discharged due to meeting the stated rehab goals.  Tressy Kunzman PT, DPT, LAT, ATC  04/03/21  8:27 AM

## 2021-04-10 ENCOUNTER — Encounter: Payer: 59 | Admitting: Physical Therapy

## 2021-04-12 ENCOUNTER — Encounter (HOSPITAL_COMMUNITY): Payer: Self-pay

## 2021-04-12 ENCOUNTER — Other Ambulatory Visit: Payer: Self-pay

## 2021-04-12 ENCOUNTER — Ambulatory Visit (HOSPITAL_COMMUNITY)
Admission: EM | Admit: 2021-04-12 | Discharge: 2021-04-12 | Disposition: A | Discharge status: Home / Self Care | Payer: 59 | Attending: Emergency Medicine | Admitting: Emergency Medicine

## 2021-04-12 DIAGNOSIS — H60331 Swimmer's ear, right ear: Secondary | ICD-10-CM | POA: Diagnosis not present

## 2021-04-12 MED ORDER — NEOMYCIN-POLYMYXIN-HC 3.5-10000-1 OT SUSP: 4.0000 [drp] | Freq: Every day | OTIC | 0 refills | Status: AC

## 2021-04-12 NOTE — ED Triage Notes (Signed)
Pt presents with right ear pain and fullness X 5 days.

## 2021-04-12 NOTE — ED Provider Notes (Signed)
[facility]    CSN: [id number] Arrival date & time: 04/12/21  1005      History   Chief Complaint Chief Complaint  Patient presents with   Otalgia   Ear Fullness    HPI Casey David is a 20 y.o. male.   Patient here for evaluation of right ear pain and fullness that has been ongoing for the past several days.  Reports using OTC ear drops with minimal relief.  Reports pain is getting worse.  Denies any recent swimming.  Denies any trauma, injury, or other precipitating event.  Denies any specific alleviating or aggravating factors.  Denies any fevers, chest pain, shortness of breath, N/V/D, numbness, tingling, weakness, abdominal pain, or headaches.     The history is provided by the patient.  Otalgia Ear Fullness   Past Medical History:  Diagnosis Date   Asthma    as a child, no problems as an adult     Patient Active Problem List   Diagnosis Date Noted   Onychomycosis 02/06/2021    Past Surgical History:  Procedure Laterality Date   HAND SURGERY Right    index finger surgery   OPEN REDUCTION INTERNAL FIXATION (ORIF) DISTAL RADIAL FRACTURE Left 11/06/2020   Procedure: OPEN REDUCTION INTERNAL FIXATION (ORIF) DISTAL RADIAL AND ULNAR FRACTURE, NON OPERATIVE TREATMENT TO RIGHT NONDISPLACED FOURTH MIDDLE PHALANX FRACTURE;  Surgeon: Ernest Mallick, MD;  Location: [facility] OR;  Service: Orthopedics;  Laterality: Left;  needs 90 minutes       Home Medications    Prior to Admission medications   Medication Sig Start Date End Date Taking? Authorizing Provider  neomycin-polymyxin-hydrocortisone (CORTISPORIN) 3.5-10000-1 OTIC suspension Place 4 drops into the right ear daily for 10 days. 04/12/21 04/22/21 Yes Ivette Loyal, NP  ibuprofen (ADVIL) 800 MG tablet Take 1 tablet (800 mg total) by mouth 3 (three) times daily. Patient taking differently: Take 800 mg by mouth 2 (two) times daily. 05/09/20   Wieters, Hallie C, PA-C  terbinafine (LAMISIL) 250 MG tablet Take 1  tablet (250 mg total) by mouth daily. 02/06/21   Sharlene Dory, DO    Family History Family History  Problem Relation Age of Onset   Heart disease Neg Hx    Cancer Neg Hx     Social History Social History   Tobacco Use   Smoking status: Never   Smokeless tobacco: Never  Vaping Use   Vaping Use: Never used  Substance Use Topics   Alcohol use: Never   Drug use: Never     Allergies   Patient has no known allergies.   Review of Systems Review of Systems  HENT:  Positive for ear pain.   All other systems reviewed and are negative.   Physical Exam Triage Vital Signs ED Triage Vitals [04/12/21 1053]  Enc Vitals Group     BP (!) 157/75     Pulse Rate 70     Resp 18     Temp 98.4 F (36.9 C)     Temp Source Oral     SpO2 95 %     Weight      Height      Head Circumference      Peak Flow      Pain Score 7     Pain Loc      Pain Edu?      Excl. in GC?    No data found.  Updated Vital Signs BP (!) 157/75 (BP Location:  Right Arm)   Pulse 70   Temp 98.4 F (36.9 C) (Oral)   Resp 18   SpO2 95%   Visual Acuity Right Eye Distance:   Left Eye Distance:   Bilateral Distance:    Right Eye Near:   Left Eye Near:    Bilateral Near:     Physical Exam Vitals and nursing note reviewed.  Constitutional:      General: He is not in acute distress.    Appearance: Normal appearance. He is not ill-appearing, toxic-appearing or diaphoretic.  HENT:     Head: Normocephalic and atraumatic.     Right Ear: Drainage, swelling and tenderness present.     Left Ear: Tympanic membrane, ear canal and external ear normal.  Eyes:     Conjunctiva/sclera: Conjunctivae normal.  Cardiovascular:     Rate and Rhythm: Normal rate.     Pulses: Normal pulses.  Pulmonary:     Effort: Pulmonary effort is normal.  Abdominal:     General: Abdomen is flat.  Musculoskeletal:        General: Normal range of motion.     Cervical back: Normal range of motion.  Skin:     General: Skin is warm and dry.  Neurological:     General: No focal deficit present.     Mental Status: He is alert and oriented to person, place, and time.  Psychiatric:        Mood and Affect: Mood normal.     UC Treatments / Results  Labs (all labs ordered are listed, but only abnormal results are displayed) Labs Reviewed - No data to display  EKG   Radiology No results found.  Procedures Procedures (including critical care time)  Medications Ordered in UC Medications - No data to display  Initial Impression / Assessment and Plan / UC Course  I have reviewed the triage vital signs and the nursing notes.  Pertinent labs & imaging results that were available during my care of the patient were reviewed by me and considered in my medical decision making (see chart for details).    Assessment negative for red flags or concerns.  Likely otitis externa.  Will treat with cortisporin drops 3 drops daily for the next 7-10 days.  May  take Ibuprofen and/or Tylenol as needed for pain relief and fever reduction.  Instructed to keep water out of the ears for the  next 7-10 days.  Instructed that all hearing aids, "ear-buds," and other similar devices should not be worn until pain has resolved and should be cleaned prior to reuse.  Follow up as needed.   Final Clinical Impressions(s) / UC Diagnoses   Final diagnoses:  Acute swimmer's ear of right side     Discharge Instructions      Put 3 drops of the Cortisporin into the right ear for the next 7-10 days. If symptoms have resolved after 7 days, you can stop the medication.    You can take Ibuprofen and/or Tylenol as needed for pain relief and fever reduction.    Keep water out of the ears for the  next 7-10 days.  You can place a cotton ball coated with petroleum jelly in the ear canal.  Hearing aids, "ear-buds," and other similar devices should not be worn until pain and discharge have subsided.  These devices should be cleaned  and disinfected prior to re-use.  Return or go to the Emergency Department if symptoms worsen or do not improve in the next few  days.      ED Prescriptions     Medication Sig Dispense Auth. Provider   neomycin-polymyxin-hydrocortisone (CORTISPORIN) 3.5-10000-1 OTIC suspension Place 4 drops into the right ear daily for 10 days. 2 mL Ivette Loyal, NP      PDMP not reviewed this encounter.   Ivette Loyal, NP 04/12/21 1121

## 2021-04-12 NOTE — Discharge Instructions (Addendum)
Put 3 drops of the Cortisporin into the right ear for the next 7-10 days. If symptoms have resolved after 7 days, you can stop the medication.    You can take Ibuprofen and/or Tylenol as needed for pain relief and fever reduction.    Keep water out of the ears for the  next 7-10 days.  You can place a cotton ball coated with petroleum jelly in the ear canal.  Hearing aids, "ear-buds," and other similar devices should not be worn until pain and discharge have subsided.  These devices should be cleaned and disinfected prior to re-use.  Return or go to the Emergency Department if symptoms worsen or do not improve in the next few days.

## 2021-04-17 ENCOUNTER — Encounter: Payer: 59 | Admitting: Physical Therapy

## 2021-04-24 ENCOUNTER — Encounter: Payer: 59 | Admitting: Physical Therapy

## 2021-05-01 ENCOUNTER — Encounter: Payer: 59 | Admitting: Physical Therapy

## 2021-05-20 ENCOUNTER — Ambulatory Visit (HOSPITAL_COMMUNITY)
Admission: EM | Admit: 2021-05-20 | Discharge: 2021-05-20 | Disposition: A | Discharge status: Home / Self Care | Payer: Medicaid Other | Attending: Medical Oncology | Admitting: Medical Oncology

## 2021-05-20 ENCOUNTER — Encounter (HOSPITAL_COMMUNITY): Payer: Self-pay

## 2021-05-20 ENCOUNTER — Other Ambulatory Visit: Payer: Self-pay

## 2021-05-20 DIAGNOSIS — Z113 Encounter for screening for infections with a predominantly sexual mode of transmission: Secondary | ICD-10-CM

## 2021-05-20 DIAGNOSIS — J029 Acute pharyngitis, unspecified: Secondary | ICD-10-CM

## 2021-05-20 LAB — HIV ANTIBODY (ROUTINE TESTING W REFLEX): HIV Screen 4th Generation wRfx: NONREACTIVE

## 2021-05-20 LAB — POCT RAPID STREP A, ED / UC: Streptococcus, Group A Screen (Direct): NEGATIVE

## 2021-05-20 NOTE — ED Triage Notes (Signed)
Pt reports sore throat x 2 days.   Pt requested STD's test.   Pt wants to know if he can have Prep prescription.

## 2021-05-20 NOTE — ED Provider Notes (Signed)
[facility]    CSN: [id number] Arrival date & time: 05/20/21  1812      History   Chief Complaint Chief Complaint  Patient presents with   Sore Throat   STD's test    HPI Casey David is a 20 y.o. male.   HPI  Sore Throat: Pt reports that he has has a sore throat for the past 2 days. He would like STI testing of his throat. He also asks about starting PREP.  He reports that 2 days ago he had a sexual encounter where he received oral sex from a male with unknown status.  He states that he then had protected sexual vaginal encounter with this male without any areas in the condom to his knowledge.  He denies any anal intercourse or receiving any other intercourse.  Since then he has had the sore throat.  He has not tried anything for symptoms.  He denies any fevers, cough or cold symptoms.  Past Medical History:  Diagnosis Date   Asthma    as a child, no problems as an adult     Patient Active Problem List   Diagnosis Date Noted   Onychomycosis 02/06/2021    Past Surgical History:  Procedure Laterality Date   HAND SURGERY Right    index finger surgery   OPEN REDUCTION INTERNAL FIXATION (ORIF) DISTAL RADIAL FRACTURE Left 11/06/2020   Procedure: OPEN REDUCTION INTERNAL FIXATION (ORIF) DISTAL RADIAL AND ULNAR FRACTURE, NON OPERATIVE TREATMENT TO RIGHT NONDISPLACED FOURTH MIDDLE PHALANX FRACTURE;  Surgeon: Ernest Mallick, MD;  Location: [facility] OR;  Service: Orthopedics;  Laterality: Left;  needs 90 minutes       Home Medications    Prior to Admission medications   Medication Sig Start Date End Date Taking? Authorizing Provider  ibuprofen (ADVIL) 800 MG tablet Take 1 tablet (800 mg total) by mouth 3 (three) times daily. Patient taking differently: Take 800 mg by mouth 2 (two) times daily. 05/09/20   Wieters, Hallie C, PA-C  terbinafine (LAMISIL) 250 MG tablet Take 1 tablet (250 mg total) by mouth daily. 02/06/21   Sharlene Dory, DO    Family  History Family History  Problem Relation Age of Onset   Heart disease Neg Hx    Cancer Neg Hx     Social History Social History   Tobacco Use   Smoking status: Never   Smokeless tobacco: Never  Vaping Use   Vaping Use: Never used  Substance Use Topics   Alcohol use: Never   Drug use: Never     Allergies   Patient has no known allergies.   Review of Systems Review of Systems  As stated above in HPI Physical Exam Triage Vital Signs ED Triage Vitals  Enc Vitals Group     BP 05/20/21 1929 (!) 156/81     Pulse Rate 05/20/21 1929 81     Resp 05/20/21 1929 18     Temp 05/20/21 1929 98.5 F (36.9 C)     Temp Source 05/20/21 1929 Oral     SpO2 05/20/21 1929 98 %     Weight --      Height --      Head Circumference --      Peak Flow --      Pain Score 05/20/21 1940 5     Pain Loc --      Pain Edu? --      Excl. in GC? --    No data  found.  Updated Vital Signs BP (!) 156/81   Pulse 81   Temp 98.5 F (36.9 C) (Oral)   Resp 18   SpO2 98%   Physical Exam Vitals and nursing note reviewed.  Constitutional:      General: He is not in acute distress.    Appearance: He is well-developed. He is not ill-appearing, toxic-appearing or diaphoretic.  HENT:     Head: Normocephalic and atraumatic.     Nose: No congestion or rhinorrhea.     Mouth/Throat:     Mouth: Mucous membranes are dry. No oral lesions.     Pharynx: Oropharynx is clear. Uvula midline. No pharyngeal swelling, oropharyngeal exudate, posterior oropharyngeal erythema or uvula swelling.     Tonsils: No tonsillar exudate or tonsillar abscesses.  Eyes:     Conjunctiva/sclera: Conjunctivae normal.     Pupils: Pupils are equal, round, and reactive to light.  Cardiovascular:     Rate and Rhythm: Normal rate and regular rhythm.     Heart sounds: Normal heart sounds.  Pulmonary:     Effort: Pulmonary effort is normal.     Breath sounds: Normal breath sounds.  Musculoskeletal:     Cervical back: Normal  range of motion and neck supple.  Lymphadenopathy:     Cervical: No cervical adenopathy.  Skin:    General: Skin is warm.     Findings: No rash.  Neurological:     Mental Status: He is alert and oriented to person, place, and time.     UC Treatments / Results  Labs (all labs ordered are listed, but only abnormal results are displayed) Labs Reviewed  HIV ANTIBODY (ROUTINE TESTING W REFLEX)  RPR  CYTOLOGY, (ORAL, ANAL, URETHRAL) ANCILLARY ONLY  CYTOLOGY, (ORAL, ANAL, URETHRAL) ANCILLARY ONLY    EKG   Radiology No results found.  Procedures Procedures (including critical care time)  Medications Ordered in UC Medications - No data to display  Initial Impression / Assessment and Plan / UC Course  I have reviewed the triage vital signs and the nursing notes.  Pertinent labs & imaging results that were available during my care of the patient were reviewed by me and considered in my medical decision making (see chart for details).     New.  STI screening pending.  Patient does not meet the postexposure management criteria for prep.  I have discussed this with patient and have also included a handout of these recommendations.  We discussed why prep medication is closely monitored.  I would recommend safe sex practices and follow-up with his primary care provider as he may benefit from starting on prep while followed by his PCP should he continue to engage in unprotected sexual encounters.  Follow-up as needed. Final Clinical Impressions(s) / UC Diagnoses   Final diagnoses:  Sore throat   Discharge Instructions   None    ED Prescriptions   None    PDMP not reviewed this encounter.   Rushie Chestnut, Cordelia Poche 05/20/21 2010

## 2021-05-21 LAB — CYTOLOGY, (ORAL, ANAL, URETHRAL) ANCILLARY ONLY
Chlamydia: NEGATIVE
Comment: NEGATIVE
Comment: NEGATIVE
Comment: NORMAL
Neisseria Gonorrhea: NEGATIVE
Trichomonas: NEGATIVE

## 2021-05-21 LAB — RPR: RPR Ser Ql: NONREACTIVE

## 2021-05-26 LAB — CYTOLOGY, (ORAL, ANAL, URETHRAL) ANCILLARY ONLY
Chlamydia: NEGATIVE
Comment: NEGATIVE
Comment: NEGATIVE
Comment: NORMAL
Neisseria Gonorrhea: NEGATIVE
Trichomonas: NEGATIVE

## 2021-06-27 ENCOUNTER — Other Ambulatory Visit: Payer: Self-pay

## 2021-06-27 ENCOUNTER — Encounter (HOSPITAL_COMMUNITY): Payer: Self-pay

## 2021-06-27 ENCOUNTER — Ambulatory Visit (HOSPITAL_COMMUNITY)
Admission: EM | Admit: 2021-06-27 | Discharge: 2021-06-27 | Disposition: A | Discharge status: Home / Self Care | Payer: 59 | Attending: Family Medicine | Admitting: Family Medicine

## 2021-06-27 DIAGNOSIS — L03011 Cellulitis of right finger: Secondary | ICD-10-CM

## 2021-06-27 DIAGNOSIS — L03019 Cellulitis of unspecified finger: Secondary | ICD-10-CM

## 2021-06-27 MED ORDER — DOXYCYCLINE HYCLATE 100 MG PO CAPS: 100.0000 mg | ORAL_CAPSULE | Freq: Two times a day (BID) | ORAL | 0 refills | Status: DC

## 2021-06-27 MED ORDER — LIDOCAINE HCL (PF) 1 % IJ SOLN
INTRAMUSCULAR | Status: AC
  Filled 2021-06-27: qty 30

## 2021-06-27 MED ORDER — CEFTRIAXONE SODIUM 500 MG IJ SOLR
500.0000 mg | Freq: Once | INTRAMUSCULAR | Status: AC
  Administered 2021-06-27: 500 mg via INTRAMUSCULAR

## 2021-06-27 MED ORDER — CEFTRIAXONE SODIUM 500 MG IJ SOLR
INTRAMUSCULAR | Status: AC
  Filled 2021-06-27: qty 500

## 2021-06-27 MED ORDER — LIDOCAINE HCL 2 % IJ SOLN
INTRAMUSCULAR | Status: AC
  Filled 2021-06-27: qty 20

## 2021-06-27 NOTE — ED Provider Notes (Signed)
[facility]    CSN: [id number] Arrival date & time: 06/27/21  1001      History   Chief Complaint Chief Complaint  Patient presents with   finger problem    HPI Casey David is a 20 y.o. male.   Patient presenting today with concern for a fingertip infection on his right thumb.  States he obtained a laceration from some thin sheet-metal several days ago to this area and despite washing with Hibiclens at home and soaking in salt water the area has become swollen, red and now a large pus pocket has formed in the pad of his finger.  He states he also had about 4 antibiotic tablets left from [place] that he took but this did not help.  He does not recall which type of antibiotic he took.  Applying pressure has been significantly painful as well as bending and he states it now feels a little bit numb and tingly.  Denies fever, chills, drainage.  Last tetanus shot was about 8 months ago per patient.  Past Medical History:  Diagnosis Date   Asthma    as a child, no problems as an adult    Patient Active Problem List   Diagnosis Date Noted   Onychomycosis 02/06/2021   Past Surgical History:  Procedure Laterality Date   HAND SURGERY Right    index finger surgery   OPEN REDUCTION INTERNAL FIXATION (ORIF) DISTAL RADIAL FRACTURE Left 11/06/2020   Procedure: OPEN REDUCTION INTERNAL FIXATION (ORIF) DISTAL RADIAL AND ULNAR FRACTURE, NON OPERATIVE TREATMENT TO RIGHT NONDISPLACED FOURTH MIDDLE PHALANX FRACTURE;  Surgeon: Ernest Mallick, MD;  Location: [facility];  Service: Orthopedics;  Laterality: Left;  needs 90 minutes     Home Medications    Prior to Admission medications   Medication Sig Start Date End Date Taking? Authorizing Provider  doxycycline (VIBRAMYCIN) 100 MG capsule Take 1 capsule (100 mg total) by mouth 2 (two) times daily. 06/27/21  Yes Particia Nearing, PA-C  ibuprofen (ADVIL) 800 MG tablet Take 1 tablet (800 mg total) by mouth 3 (three) times  daily. Patient taking differently: Take 800 mg by mouth 2 (two) times daily. 05/09/20   Wieters, Hallie C, PA-C  terbinafine (LAMISIL) 250 MG tablet Take 1 tablet (250 mg total) by mouth daily. 02/06/21   Sharlene Dory, DO    Family History Family History  Problem Relation Age of Onset   Heart disease Neg Hx    Cancer Neg Hx     Social History Social History   Tobacco Use   Smoking status: Never   Smokeless tobacco: Never  Vaping Use   Vaping Use: Never used  Substance Use Topics   Alcohol use: Never   Drug use: Never     Allergies   Patient has no known allergies.   Review of Systems Review of Systems Per HPI  Physical Exam Triage Vital Signs ED Triage Vitals  Enc Vitals Group     BP 06/27/21 1019 124/77     Pulse Rate 06/27/21 1019 60     Resp 06/27/21 1019 19     Temp 06/27/21 1019 98.3 F (36.8 C)     Temp Source 06/27/21 1019 Oral     SpO2 06/27/21 1019 97 %     Weight --      Height --      Head Circumference --      Peak Flow --      Pain Score 06/27/21 1017  0     Pain Loc --      Pain Edu? --      Excl. in GC? --    No data found.  Updated Vital Signs BP 124/77 (BP Location: Right Arm)   Pulse 60   Temp 98.3 F (36.8 C) (Oral)   Resp 19   SpO2 97%   Visual Acuity Right Eye Distance:   Left Eye Distance:   Bilateral Distance:    Right Eye Near:   Left Eye Near:    Bilateral Near:     Physical Exam Vitals and nursing note reviewed.  Constitutional:      Appearance: Normal appearance.  HENT:     Head: Atraumatic.  Eyes:     Extraocular Movements: Extraocular movements intact.     Conjunctiva/sclera: Conjunctivae normal.  Cardiovascular:     Rate and Rhythm: Normal rate and regular rhythm.  Pulmonary:     Effort: Pulmonary effort is normal.     Breath sounds: Normal breath sounds.  Musculoskeletal:        General: Swelling, tenderness and signs of injury present. Normal range of motion.     Cervical back: Normal range  of motion and neck supple.     Comments: Diffuse distal right thumb edema, erythema, significant tenderness to palpation.  Large pocket of purulent drainage present on the pad of the right thumb  Skin:    General: Skin is warm and dry.     Findings: Erythema present.  Neurological:     General: No focal deficit present.     Mental Status: He is oriented to person, place, and time.     Motor: No weakness.     Comments: Right thumb neurovascularly intact with cap refill 2 seconds  Psychiatric:        Mood and Affect: Mood normal.        Thought Content: Thought content normal.        Judgment: Judgment normal.     UC Treatments / Results  Labs (all labs ordered are listed, but only abnormal results are displayed) Labs Reviewed - No data to display  EKG   Radiology No results found.  Procedures Incision and Drainage  Date/Time: 06/27/2021 11:45 AM Performed by: Particia Nearing, PA-C Authorized by: Particia Nearing, PA-C   Consent:    Consent obtained:  Verbal   Consent given by:  Patient   Risks, benefits, and alternatives were discussed: yes     Risks discussed:  Bleeding, incomplete drainage, infection and pain   Alternatives discussed:  Alternative treatment (Emergency department) Universal protocol:    Procedure explained and questions answered to patient or proxy's satisfaction: yes     Patient identity confirmed:  Verbally with patient Location:    Type:  Abscess   Location:  Upper extremity   Upper extremity location:  Finger   Finger location:  R thumb Pre-procedure details:    Skin preparation:  Chlorhexidine with alcohol Sedation:    Sedation type:  None Anesthesia:    Anesthesia method:  Local infiltration   Local anesthetic:  Lidocaine 2% w/o epi Procedure type:    Complexity:  Simple Procedure details:    Ultrasound guidance: no     Needle aspiration: no     Incision types:  Stab incision   Incision depth:  Dermal   Wound  management:  Probed and deloculated   Drainage:  Bloody and purulent   Drainage amount:  Moderate   Wound treatment:  Wound left open   Packing materials:  None Post-procedure details:    Procedure completion:  Tolerated well, no immediate complications (including critical care time)  Medications Ordered in UC Medications  cefTRIAXone (ROCEPHIN) injection 500 mg (500 mg Intramuscular Given 06/27/21 1121)    Initial Impression / Assessment and Plan / UC Course  I have reviewed the triage vital signs and the nursing notes.  Pertinent labs & imaging results that were available during my care of the patient were reviewed by me and considered in my medical decision making (see chart for details).     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  I&D was performed with moderate drainage of purulence and bloody discharge.  IM Rocephin given in clinic and 10-day course of doxycycline sent to pharmacy.  Has Hibiclens at home, discussed Epsom salt soaks, Hibiclens, Neosporin, keeping covered, arm elevation.  Discussed if worsening or not improving over the next 12 to 24 hours to go to the emergency department as these can be emergent quickly.  Hand specialist information given for follow-up next week for recheck.  Over-the-counter pain relievers as needed.  Final Clinical Impressions(s) / UC Diagnoses   Final diagnoses:  Felon of finger     Discharge Instructions      Go directly to the emergency department if your finger worsens or does not improve in the next 24 hours.     ED Prescriptions     Medication Sig Dispense Auth. Provider   doxycycline (VIBRAMYCIN) 100 MG capsule Take 1 capsule (100 mg total) by mouth 2 (two) times daily. 20 capsule Particia Nearing, [place]      PDMP not reviewed this encounter.   Particia Nearing, [place] 06/27/21 1148

## 2021-06-27 NOTE — Discharge Instructions (Signed)
Go directly to the emergency department if your finger worsens or does not improve in the next 24 hours.

## 2021-06-27 NOTE — ED Triage Notes (Signed)
Pt presents with a cut on the R thumb. States he cut it a few days ago and noticed it is now infected. States it feels numb and applying pressure hurts.

## 2021-07-23 ENCOUNTER — Encounter (HOSPITAL_COMMUNITY): Payer: Self-pay | Admitting: Emergency Medicine

## 2021-07-23 ENCOUNTER — Ambulatory Visit (HOSPITAL_COMMUNITY)
Admission: EM | Admit: 2021-07-23 | Discharge: 2021-07-23 | Disposition: A | Discharge status: Home / Self Care | Payer: 59 | Attending: Urgent Care | Admitting: Urgent Care

## 2021-07-23 ENCOUNTER — Other Ambulatory Visit: Payer: Self-pay

## 2021-07-23 DIAGNOSIS — L0291 Cutaneous abscess, unspecified: Secondary | ICD-10-CM

## 2021-07-23 DIAGNOSIS — L0201 Cutaneous abscess of face: Secondary | ICD-10-CM | POA: Diagnosis not present

## 2021-07-23 MED ORDER — NAPROXEN 500 MG PO TABS: 500.0000 mg | ORAL_TABLET | Freq: Two times a day (BID) | ORAL | 0 refills | Status: DC

## 2021-07-23 MED ORDER — AMOXICILLIN-POT CLAVULANATE 875-125 MG PO TABS: 1.0000 | ORAL_TABLET | Freq: Two times a day (BID) | ORAL | 0 refills | Status: DC

## 2021-07-23 NOTE — ED Triage Notes (Signed)
Pt is present today with an abscess on his top lip. Pt states that he noticed the abscess Tuesday. Pt states that he has left over antibiotics and took two tablets over the last two days

## 2021-07-23 NOTE — ED Provider Notes (Signed)
[facility]   MRN: [id number] DOB: 21-Jun-2001  Subjective:   Casey David is a 20 y.o. male presenting for acute onset this morning of right upper lip swelling, tenderness.  Patient has had difficulty with recurrent abscesses of the skin.  Has not seen dermatologist for this.  Denies any dental, gum pain, pain with chewing.  No difficulty controlling secretions.  No fever, nausea, vomiting, lymph node pain around his neck.  He did take 2 doses of doxycycline, an antibiotic course leftover from her previous boil/abscess.  No current facility-administered medications for this encounter.  Current Outpatient Medications:    doxycycline (VIBRAMYCIN) 100 MG capsule, Take 1 capsule (100 mg total) by mouth 2 (two) times daily., Disp: 20 capsule, Rfl: 0   ibuprofen (ADVIL) 800 MG tablet, Take 1 tablet (800 mg total) by mouth 3 (three) times daily. (Patient taking differently: Take 800 mg by mouth 2 (two) times daily.), Disp: 21 tablet, Rfl: 0   terbinafine (LAMISIL) 250 MG tablet, Take 1 tablet (250 mg total) by mouth daily., Disp: 30 tablet, Rfl: 2   No Known Allergies  Past Medical History:  Diagnosis Date   Asthma    as a child, no problems as an adult      Past Surgical History:  Procedure Laterality Date   HAND SURGERY Right    index finger surgery   OPEN REDUCTION INTERNAL FIXATION (ORIF) DISTAL RADIAL FRACTURE Left 11/06/2020   Procedure: OPEN REDUCTION INTERNAL FIXATION (ORIF) DISTAL RADIAL AND ULNAR FRACTURE, NON OPERATIVE TREATMENT TO RIGHT NONDISPLACED FOURTH MIDDLE PHALANX FRACTURE;  Surgeon: Ernest Mallick, MD;  Location: [facility];  Service: Orthopedics;  Laterality: Left;  needs 90 minutes    Family History  Problem Relation Age of Onset   Heart disease Neg Hx    Cancer Neg Hx     Social History   Tobacco Use   Smoking status: Never   Smokeless tobacco: Never  Vaping Use   Vaping Use: Never used  Substance Use Topics   Alcohol use: Never    Drug use: Never    ROS   Objective:   Vitals: BP (!) 154/95   Pulse 94   Temp 98.4 F (36.9 C)   Resp 18   SpO2 100%   Physical Exam Constitutional:      General: He is not in acute distress.    Appearance: Normal appearance. He is well-developed and normal weight. He is not ill-appearing, toxic-appearing or diaphoretic.  HENT:     Head: Normocephalic and atraumatic.     Right Ear: External ear normal.     Left Ear: External ear normal.     Nose: Nose normal.     Mouth/Throat:     Dentition: Normal dentition. Does not have dentures. No dental tenderness, gingival swelling, dental caries, dental abscesses or gum lesions.     Pharynx: Oropharynx is clear.   Eyes:     General: No scleral icterus.       Right eye: No discharge.        Left eye: No discharge.     Extraocular Movements: Extraocular movements intact.     Pupils: Pupils are equal, round, and reactive to light.  Cardiovascular:     Rate and Rhythm: Normal rate.  Pulmonary:     Effort: Pulmonary effort is normal.  Musculoskeletal:     Cervical back: Normal range of motion.  Neurological:     Mental Status: He is alert and  oriented to person, place, and time.  Psychiatric:        Mood and Affect: Mood normal.        Behavior: Behavior normal.        Thought Content: Thought content normal.        Judgment: Judgment normal.    Assessment and Plan :   PDMP not reviewed this encounter.  1. Abscess    Area is not amenable to incision and drainage.  Recommended Augmentin for anaerobic coverage as he is not responding to doxycycline.  Use naproxen for pain and inflammation. Counseled patient on potential for adverse effects with medications prescribed/recommended today, ER and return-to-clinic precautions discussed, patient verbalized understanding.    Wallis Bamberg, [place] 07/27/21 [phone]

## 2021-12-25 ENCOUNTER — Ambulatory Visit (INDEPENDENT_AMBULATORY_CARE_PROVIDER_SITE_OTHER): Payer: 59 | Admitting: Family Medicine

## 2021-12-25 ENCOUNTER — Encounter: Payer: Self-pay | Admitting: Family Medicine

## 2021-12-25 VITALS — BP 120/78 | HR 87 | Temp 97.5°F | Ht 69.0 in | Wt 213.2 lb

## 2021-12-25 DIAGNOSIS — L0293 Carbuncle, unspecified: Secondary | ICD-10-CM

## 2021-12-25 DIAGNOSIS — B351 Tinea unguium: Secondary | ICD-10-CM | POA: Diagnosis not present

## 2021-12-25 MED ORDER — TERBINAFINE HCL 250 MG PO TABS: 250.0000 mg | ORAL_TABLET | Freq: Every day | ORAL | 2 refills | Status: DC

## 2021-12-25 NOTE — Patient Instructions (Signed)
To prepare a bleach bath, one-fourth to one-half cup of bleach is placed in a full bathtub (about 40 gallons) of water. Bleach baths are usually taken for 5 to 10 minutes twice per week and should be followed by application of an emollient. ? ?Use Neosporin twice daily for 7-10 days if an ingrown hair arises. ? ?Let us know if you need anything. ?

## 2021-12-25 NOTE — Progress Notes (Addendum)
Chief Complaint  ?Patient presents with  ? Nail Problem  ?  Fungus ?  ? ? ?Casey David is a 21 y.o. male here for a skin complaint. ? ?Duration: 6 months ?Location: toenails ?Pruritic? No ?Painful? No ?Drainage? No ?New soaps/lotions/topicals/detergents? No ?Sick contacts? No ?Other associated symptoms: thickened and yellowed nails; no redness ?Therapies tried thus far: tried Lamisil in past that worked well ? ?Over past year has had recurrent boils on skin. Some in thighs, scalp, face, armpit, arm. No current issues. Sometimes starts as an ingrown hair.  ? ?Past Medical History:  ?Diagnosis Date  ? Asthma   ? as a child, no problems as an adult   ? ? ?BP 120/78   Pulse 87   Temp (!) 97.5 ?F (36.4 ?C) (Oral)   Ht 5\' 9"  (1.753 m)   Wt 213 lb 4 oz (96.7 kg)   SpO2 99%   BMI 31.49 kg/m?  ?Gen: awake, alert, appearing stated age ?Lungs: No accessory muscle use ?Skin: thickened and yellowed nails of 1-4 and 1 digits on L and R ft respectively without ttp or periungual erythema.  ?Psych: Age appropriate judgment and insight ? ?Onychomycosis - Plan: terbinafine (LAMISIL) 250 MG tablet ? 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 Restart Lamisil 250 mg/d. Will reck in 2 mo and ck LFT's.  ?For recurrent boils, will consider bleach bath. If ingrown hair arises, use TAO bid for 7-10 d. Could consider derm referral if this does not help. Can come here for I&D.  ?The patient voiced understanding and agreement to the plan. ? ?Shelda Pal, DO ?12/25/21 ?3:43 PM ? ?

## 2021-12-27 ENCOUNTER — Ambulatory Visit (HOSPITAL_COMMUNITY)
Admission: EM | Admit: 2021-12-27 | Discharge: 2021-12-27 | Disposition: A | Discharge status: Home / Self Care | Payer: 59 | Attending: Internal Medicine | Admitting: Internal Medicine

## 2021-12-27 ENCOUNTER — Encounter: Payer: Self-pay | Admitting: Family Medicine

## 2021-12-27 ENCOUNTER — Encounter (HOSPITAL_COMMUNITY): Payer: Self-pay

## 2021-12-27 DIAGNOSIS — L0201 Cutaneous abscess of face: Secondary | ICD-10-CM | POA: Diagnosis not present

## 2021-12-27 DIAGNOSIS — L0291 Cutaneous abscess, unspecified: Secondary | ICD-10-CM

## 2021-12-27 MED ORDER — MUPIROCIN 2 % EX OINT: 1.0000 "application " | TOPICAL_OINTMENT | Freq: Two times a day (BID) | CUTANEOUS | 0 refills | Status: DC

## 2021-12-27 MED ORDER — AMOXICILLIN-POT CLAVULANATE 875-125 MG PO TABS: 1.0000 | ORAL_TABLET | Freq: Two times a day (BID) | ORAL | 0 refills | Status: DC

## 2021-12-27 NOTE — ED Provider Notes (Signed)
?[facility] ? ? ? ?CSN: [id number] ?Arrival date & time: 12/27/21  1009 ? ? ?  ? ?History   ?Chief Complaint ?Chief Complaint  ?Patient presents with  ? Abscess  ? ? ?HPI ?Casey David is a 21 y.o. male.  ? ?Patient presents with abscess-like lesion directly above left upper lip at nasal opening that has been present for approximately 3 days.  Patient reports drainage yesterday but no drainage today.  Denies fevers, body aches, chills.  He reports history of the same with resolution with antibiotic pills.  Has been using Neosporin without relief. ? ? ?Abscess ? ?Past Medical History:  ?Diagnosis Date  ? Asthma   ? as a child, no problems as an adult   ? ? ?Patient Active Problem List  ? Diagnosis Date Noted  ? Onychomycosis 02/06/2021  ? ? ?Past Surgical History:  ?Procedure Laterality Date  ? HAND SURGERY Right   ? index finger surgery  ? OPEN REDUCTION INTERNAL FIXATION (ORIF) DISTAL RADIAL FRACTURE Left 11/06/2020  ? Procedure: OPEN REDUCTION INTERNAL FIXATION (ORIF) DISTAL RADIAL AND ULNAR FRACTURE, NON OPERATIVE TREATMENT TO RIGHT NONDISPLACED FOURTH MIDDLE PHALANX FRACTURE;  Surgeon: Ernest Mallick, MD;  Location: [facility];  Service: Orthopedics;  Laterality: Left;  needs 90 minutes  ? ? ? ? ? ?Home Medications   ? ?Prior to Admission medications   ?Medication Sig Start Date End Date Taking? Authorizing Provider  ?amoxicillin-clavulanate (AUGMENTIN) 875-125 MG tablet Take 1 tablet by mouth every 12 (twelve) hours. 12/27/21  Yes Gustavus Bryant, FNP  ?mupirocin ointment (BACTROBAN) 2 % Apply 1 application. topically 2 (two) times daily. Apply directly to affected area 12/27/21  Yes Gustavus Bryant, FNP  ?terbinafine (LAMISIL) 250 MG tablet Take 1 tablet (250 mg total) by mouth daily. 12/25/21   Sharlene Dory, DO  ? ? ?Family History ?Family History  ?Problem Relation Age of Onset  ? Healthy Mother   ? Healthy Father   ? Heart disease Neg Hx   ? Cancer Neg Hx   ? ? ?Social History ?Social  History  ? ?Tobacco Use  ? Smoking status: Never  ? Smokeless tobacco: Never  ?Vaping Use  ? Vaping Use: Never used  ?Substance Use Topics  ? Alcohol use: Never  ? Drug use: Never  ? ? ? ?Allergies   ?Patient has no known allergies. ? ? ?Review of Systems ?Review of Systems ?Per HPI ? ?Physical Exam ?Triage Vital Signs ?ED Triage Vitals  ?Enc Vitals Group  ?   BP 12/27/21 1043 (!) 142/81  ?   Pulse Rate 12/27/21 1043 71  ?   Resp 12/27/21 1043 17  ?   Temp 12/27/21 1043 98 ?F (36.7 ?C)  ?   Temp Source 12/27/21 1043 Oral  ?   SpO2 12/27/21 1043 97 %  ?   Weight --   ?   Height --   ?   Head Circumference --   ?   Peak Flow --   ?   Pain Score 12/27/21 1041 2  ?   Pain Loc --   ?   Pain Edu? --   ?   Excl. in GC? --   ? ?No data found. ? ?Updated Vital Signs ?BP (!) 142/81 (BP Location: Right Arm)   Pulse 71   Temp 98 ?F (36.7 ?C) (Oral)   Resp 17   SpO2 97%  ? ?Visual Acuity ?Right Eye Distance:   ?Left Eye Distance:   ?  Bilateral Distance:   ? ?Right Eye Near:   ?Left Eye Near:    ?Bilateral Near:    ? ?Physical Exam ?Constitutional:   ?   General: He is not in acute distress. ?   Appearance: Normal appearance. He is not toxic-appearing or diaphoretic.  ?HENT:  ?   Head: Normocephalic and atraumatic.  ?   Comments: Mildly raised, indurated,  pinpoint lesion present at nasal opening.  No drainage noted. ?Eyes:  ?   Extraocular Movements: Extraocular movements intact.  ?   Conjunctiva/sclera: Conjunctivae normal.  ?Pulmonary:  ?   Effort: Pulmonary effort is normal.  ?Neurological:  ?   General: No focal deficit present.  ?   Mental Status: He is alert and oriented to person, place, and time. Mental status is at baseline.  ?Psychiatric:     ?   Mood and Affect: Mood normal.     ?   Behavior: Behavior normal.     ?   Thought Content: Thought content normal.     ?   Judgment: Judgment normal.  ? ? ? ?UC Treatments / Results  ?Labs ?(all labs ordered are listed, but only abnormal results are displayed) ?Labs Reviewed  - No data to display ? ?EKG ? ? ?Radiology ?No results found. ? ?Procedures ?Procedures (including critical care time) ? ?Medications Ordered in UC ?Medications - No data to display ? ?Initial Impression / Assessment and Plan / UC Course  ?I have reviewed the triage vital signs and the nursing notes. ? ?Pertinent labs & imaging results that were available during my care of the patient were reviewed by me and considered in my medical decision making (see chart for details). ? ?  ? ?No I&D necessary given size of lesion.  Will prescribe mupirocin ointment.  Augmentin also prescribed as patient reports that these typically get larger and antibiotic pills are needed.  Patient has also seen resolution with augmentin in the past. Discussed warm compresses.  Discussed return precautions.  Patient verbalized understanding and was agreeable with plan. ?Final Clinical Impressions(s) / UC Diagnoses  ? ?Final diagnoses:  ?Abscess  ? ? ? ?Discharge Instructions   ? ?  ?You have been prescribed 2 medications to help alleviate symptoms.  Please also use warm compresses.  Follow-up if symptoms persist or worsen. ? ? ? ?ED Prescriptions   ? ? Medication Sig Dispense Auth. Provider  ? amoxicillin-clavulanate (AUGMENTIN) 875-125 MG tablet Take 1 tablet by mouth every 12 (twelve) hours. 14 tablet Gustavus Bryant, [place]  ? mupirocin ointment (BACTROBAN) 2 % Apply 1 application. topically 2 (two) times daily. Apply directly to affected area 22 g Gustavus Bryant, [place]  ? ?  ? ?PDMP not reviewed this encounter. ?  ?Gustavus Bryant, [place] ?12/27/21 1112 ? ?

## 2021-12-27 NOTE — Discharge Instructions (Signed)
You have been prescribed 2 medications to help alleviate symptoms.  Please also use warm compresses.  Follow-up if symptoms persist or worsen. ?

## 2021-12-27 NOTE — ED Triage Notes (Signed)
3 day h/o abscess near his mouth that has increased in size as of yesterday. ?Has been using a neosporin w/o relief. ?

## 2021-12-28 ENCOUNTER — Other Ambulatory Visit: Payer: Self-pay | Admitting: Family Medicine

## 2021-12-28 DIAGNOSIS — L989 Disorder of the skin and subcutaneous tissue, unspecified: Secondary | ICD-10-CM

## 2022-02-12 ENCOUNTER — Ambulatory Visit (INDEPENDENT_AMBULATORY_CARE_PROVIDER_SITE_OTHER): Payer: 59 | Admitting: Family Medicine

## 2022-02-12 ENCOUNTER — Encounter: Payer: Self-pay | Admitting: Family Medicine

## 2022-02-12 VITALS — BP 108/62 | HR 68 | Temp 97.8°F | Ht 70.0 in | Wt 207.2 lb

## 2022-02-12 DIAGNOSIS — Z0000 Encounter for general adult medical examination without abnormal findings: Secondary | ICD-10-CM

## 2022-02-12 DIAGNOSIS — Z1159 Encounter for screening for other viral diseases: Secondary | ICD-10-CM

## 2022-02-12 NOTE — Patient Instructions (Signed)
Give us 2-3 business days to get the results of your labs back.   Keep the diet clean and stay active.  Do monthly self testicular checks in the shower. You are feeling for lumps/bumps that don't belong. If you feel anything like this, let me know!  Please get me a copy of your advanced directive form at your convenience.   Let us know if you need anything.  

## 2022-02-12 NOTE — Progress Notes (Signed)
Chief Complaint  Patient presents with   Annual Exam    Well Male Casey David is here for a complete physical.   His last physical was >1 year ago.  Current diet: in general, a "so-so" diet.   Current exercise: works in [employer]; sometimes jogging, lifting weights Weight trend: stable Fatigue out of ordinary? No. Seat belt? Yes.   Advanced directive? No  Health maintenance Tetanus- Yes HIV- Yes Hep C- No  Past Medical History:  Diagnosis Date   Asthma    as a child, no problems as an adult      Past Surgical History:  Procedure Laterality Date   HAND SURGERY Right    index finger surgery   OPEN REDUCTION INTERNAL FIXATION (ORIF) DISTAL RADIAL FRACTURE Left 11/06/2020   Procedure: OPEN REDUCTION INTERNAL FIXATION (ORIF) DISTAL RADIAL AND ULNAR FRACTURE, NON OPERATIVE TREATMENT TO RIGHT NONDISPLACED FOURTH MIDDLE PHALANX FRACTURE;  Surgeon: Verner Mould, MD;  Location: [facility];  Service: Orthopedics;  Laterality: Left;  needs 90 minutes    Medications  Current Outpatient Medications on File Prior to Visit  Medication Sig Dispense Refill   terbinafine (LAMISIL) 250 MG tablet Take 1 tablet (250 mg total) by mouth daily. 30 tablet 2    Allergies No Known Allergies  Family History Family History  Problem Relation Age of Onset   Healthy Mother    Healthy Father    Heart disease Neg Hx    Cancer Neg Hx     Review of Systems: Constitutional: no fevers or chills Eye:  no recent significant change in vision Ear/Nose/Mouth/Throat:  Ears:  no hearing loss Nose/Mouth/Throat:  no complaints of nasal congestion, no sore throat Cardiovascular:  no chest pain Respiratory:  no shortness of breath Gastrointestinal:  no abdominal pain, no change in bowel habits GU:  Male: negative for dysuria Musculoskeletal/Extremities:  no pain of the joints Integumentary (Skin/Breast):  no abnormal skin lesions reported Neurologic:  no headaches Endocrine: No unexpected weight  changes Hematologic/Lymphatic:  no night sweats  Exam BP 108/62   Pulse 68   Temp 97.8 F (36.6 C) (Oral)   Ht 5\' 10"  (1.778 m)   Wt 207 lb 4 oz (94 kg)   SpO2 99%   BMI 29.74 kg/m  General:  well developed, well nourished, in no apparent distress Skin:  no significant moles, warts, or growths Head:  no masses, lesions, or tenderness Eyes:  pupils equal and round, sclera anicteric without injection Ears:  canals without lesions, TMs shiny without retraction, no obvious effusion, no erythema Nose:  nares patent, septum midline, mucosa normal Throat/Pharynx:  lips and gingiva without lesion; tongue and uvula midline; non-inflamed pharynx; no exudates or postnasal drainage Neck: neck supple without adenopathy, thyromegaly, or masses Lungs:  clear to auscultation, breath sounds equal bilaterally, no respiratory distress Cardio:  regular rate and rhythm, no bruits, no LE edema Abdomen:  abdomen soft, nontender; bowel sounds normal; no masses or organomegaly Genital (male): Deferred Rectal: Deferred Musculoskeletal:  symmetrical muscle groups noted without atrophy or deformity Extremities:  no clubbing, cyanosis, or edema, no deformities, no skin discoloration Neuro:  gait normal; deep tendon reflexes normal and symmetric Psych: well oriented with normal range of affect and appropriate judgment/insight  Assessment and Plan  Well adult exam - Plan: CBC, Comprehensive metabolic panel, Lipid panel  Encounter for hepatitis C screening test for low risk patient - Plan: Hepatitis C antibody   Well 21 y.o. male. Counseled on diet and exercise.  Self testicular exams recommended at least monthly.  Advanced directive form provided today.  Other orders as above. Follow up in 1 year pending the above workup. The patient voiced understanding and agreement to the plan.  [place], DO 02/12/22 2:26 PM

## 2022-02-15 LAB — COMPREHENSIVE METABOLIC PANEL
AG Ratio: 1.2 (calc) (ref 1.0–2.5)
ALT: 40 U/L (ref 9–46)
AST: 24 U/L (ref 10–40)
Albumin: 4.4 g/dL (ref 3.6–5.1)
Alkaline phosphatase (APISO): 79 U/L (ref 36–130)
BUN: 19 mg/dL (ref 7–25)
CO2: 24 mmol/L (ref 20–32)
Calcium: 9.7 mg/dL (ref 8.6–10.3)
Chloride: 102 mmol/L (ref 98–110)
Creat: 0.91 mg/dL (ref 0.60–1.24)
Globulin: 3.6 g/dL (calc) (ref 1.9–3.7)
Glucose, Bld: 93 mg/dL (ref 65–99)
Potassium: 4.2 mmol/L (ref 3.5–5.3)
Sodium: 137 mmol/L (ref 135–146)
Total Bilirubin: 0.4 mg/dL (ref 0.2–1.2)
Total Protein: 8 g/dL (ref 6.1–8.1)

## 2022-02-15 LAB — HEPATITIS C ANTIBODY
Hepatitis C Ab: NONREACTIVE
SIGNAL TO CUT-OFF: 0.32 (ref <1.00)

## 2022-02-15 LAB — LIPID PANEL
Cholesterol: 124 mg/dL (ref <200)
HDL: 42 mg/dL (ref >=40)
LDL Cholesterol (Calc): 65 mg/dL (calc)
Non-HDL Cholesterol (Calc): 82 mg/dL (calc) (ref <130)
Total CHOL/HDL Ratio: 3 (calc) (ref <5.0)
Triglycerides: 89 mg/dL (ref <150)

## 2022-02-15 LAB — CBC
HCT: 43.1 % (ref 38.5–50.0)
Hemoglobin: 14.9 g/dL (ref 13.2–17.1)
MCH: 30.3 pg (ref 27.0–33.0)
MCHC: 34.6 g/dL (ref 32.0–36.0)
MCV: 87.8 fL (ref 80.0–100.0)
MPV: 10.2 fL (ref 7.5–12.5)
Platelets: 290 10*3/uL (ref 140–400)
RBC: 4.91 10*6/uL (ref 4.20–5.80)
RDW: 12.7 % (ref 11.0–15.0)
WBC: 7.8 10*3/uL (ref 3.8–10.8)
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
## 2022-12-29 ENCOUNTER — Encounter (HOSPITAL_COMMUNITY): Payer: Self-pay | Admitting: Emergency Medicine

## 2022-12-29 ENCOUNTER — Other Ambulatory Visit: Payer: Self-pay

## 2022-12-29 ENCOUNTER — Telehealth: Payer: Self-pay | Admitting: Surgery

## 2022-12-29 ENCOUNTER — Emergency Department (HOSPITAL_COMMUNITY)
Admission: EM | Admit: 2022-12-29 | Discharge: 2022-12-29 | Disposition: A | Discharge status: Home / Self Care | Payer: Medicaid Other | Attending: Student | Admitting: Student

## 2022-12-29 DIAGNOSIS — Z202 Contact with and (suspected) exposure to infections with a predominantly sexual mode of transmission: Secondary | ICD-10-CM

## 2022-12-29 LAB — COMPREHENSIVE METABOLIC PANEL
ALT: 52 U/L — ABNORMAL HIGH (ref 0–44)
AST: 30 U/L (ref 15–41)
Albumin: 3.8 g/dL (ref 3.5–5.0)
Alkaline Phosphatase: 75 U/L (ref 38–126)
Anion gap: 9 (ref 5–15)
BUN: 21 mg/dL — ABNORMAL HIGH (ref 6–20)
CO2: 23 mmol/L (ref 22–32)
Calcium: 9 mg/dL (ref 8.9–10.3)
Chloride: 101 mmol/L (ref 98–111)
Creatinine, Ser: 0.87 mg/dL (ref 0.61–1.24)
GFR, Estimated: >60 mL/min (ref >60)
Glucose, Bld: 109 mg/dL — ABNORMAL HIGH (ref 70–99)
Potassium: 3.6 mmol/L (ref 3.5–5.1)
Sodium: 133 mmol/L — ABNORMAL LOW (ref 135–145)
Total Bilirubin: 0.6 mg/dL (ref 0.3–1.2)
Total Protein: 7.7 g/dL (ref 6.5–8.1)

## 2022-12-29 LAB — HEPATITIS B SURFACE ANTIGEN: Hepatitis B Surface Ag: NONREACTIVE

## 2022-12-29 LAB — RPR: RPR Ser Ql: NONREACTIVE

## 2022-12-29 LAB — HEPATITIS C ANTIBODY: HCV Ab: NONREACTIVE

## 2022-12-29 LAB — RAPID HIV SCREEN (HIV 1/2 AB+AG)
HIV 1/2 Antibodies: NONREACTIVE
HIV-1 P24 Antigen - HIV24: NONREACTIVE

## 2022-12-29 MED ORDER — ELVITEG-COBIC-EMTRICIT-TENOFAF 150-150-200-10 MG PO TABS: 1.0000 | ORAL_TABLET | Freq: Every day | ORAL | 0 refills | Status: DC

## 2022-12-29 MED ORDER — ELVITEG-COBIC-EMTRICIT-TENOFAF 150-150-200-10 MG PREPACK
1.0000 | ORAL_TABLET | Freq: Once | ORAL | Status: AC
  Administered 2022-12-29: 1 via ORAL
  Filled 2022-12-29: qty 1

## 2022-12-29 NOTE — ED Provider Notes (Signed)
[place] EMERGENCY DEPARTMENT AT [facility] Provider Note   CSN: [id number] Arrival date & time: 12/29/22  0008     History  Chief Complaint  Patient presents with   Exposure to STD    Casey David is a 22 y.o. male.  HPI   Patient without significant medical history presented with complaints of exposure.  Patient states that he was having sexual intercourse and the condom broke and he is concerned that he might have HIV.  He states his partner does not have known HIV, but he would like to be tested.  Patient states he is having no symptoms, not endorsing dysuria hematuria denies any testicular pain, states that sexual encounter happened approximate 3 hours prior to arrival.    Home Medications Prior to Admission medications   Medication Sig Start Date End Date Taking? Authorizing Provider  elvitegravir-cobicistat-emtricitabine-tenofovir (GENVOYA) 150-150-200-10 MG TABS tablet Take 1 tablet by mouth daily with breakfast. 12/29/22  Yes Carroll Sage, PA-C  terbinafine (LAMISIL) 250 MG tablet Take 1 tablet (250 mg total) by mouth daily. 12/25/21   Sharlene Dory, DO      Allergies    Patient has no known allergies.    Review of Systems   Review of Systems  Constitutional:  Negative for chills and fever.  Respiratory:  Negative for shortness of breath.   Cardiovascular:  Negative for chest pain.  Gastrointestinal:  Negative for abdominal pain.  Neurological:  Negative for headaches.    Physical Exam Updated Vital Signs BP (!) 154/101 (BP Location: Right Arm)   Pulse 71   Temp 98.1 F (36.7 C)   Resp 18   Ht  (1.778 m)   Wt 95.3 kg   SpO2 97%   BMI 30.13 kg/m  Physical Exam Vitals and nursing note reviewed.  Constitutional:      General: He is not in acute distress.    Appearance: He is not ill-appearing.  HENT:     Head: Normocephalic and atraumatic.     Nose: No congestion.  Eyes:     Conjunctiva/sclera: Conjunctivae normal.   Cardiovascular:     Rate and Rhythm: Normal rate and regular rhythm.     Pulses: Normal pulses.  Pulmonary:     Effort: Pulmonary effort is normal.  Skin:    General: Skin is warm and dry.  Neurological:     Mental Status: He is alert.  Psychiatric:        Mood and Affect: Mood normal.     ED Results / Procedures / Treatments   Labs (all labs ordered are listed, but only abnormal results are displayed) Labs Reviewed  COMPREHENSIVE METABOLIC PANEL - Abnormal; Notable for the following components:      Result Value   Sodium 133 (*)    Glucose, Bld 109 (*)    BUN 21 (*)    ALT 52 (*)    All other components within normal limits  RAPID HIV SCREEN (HIV 1/2 AB+AG)  HEPATITIS B SURFACE ANTIGEN  RPR  HEPATITIS C ANTIBODY  GC/CHLAMYDIA PROBE AMP ([place]) NOT AT [facility]    EKG None  Radiology No results found.  Procedures Procedures    Medications Ordered in ED Medications  elvitegravir-cobicistat-emtricitabine-tenofovir (GENVOYA) 150-150-200-10 Prepack 1 each (has no administration in time range)    ED Course/ Medical Decision Making/ A&P  Medical Decision Making Amount and/or Complexity of Data Reviewed Labs: ordered.  Risk Prescription drug management.   This patient presents to the ED for concern of exposure, this involves an extensive number of treatment options, and is a complaint that carries with it a high risk of complications and morbidity.  The differential diagnosis includes HIV, syphilis, hepatitis, GC chlamydia    Additional history obtained:  Additional history obtained from N/A External records from outside source obtained and reviewed including PCP notes   Co morbidities that complicate the patient evaluation  N/A  Social Determinants of Health:  N/A   Lab Tests:  I Ordered, and personally interpreted labs.  The pertinent results include: CMP reveals sodium 133 glucose 109, BUN 21, ALT 52, rapid HIV  screen is negative   Imaging Studies ordered:  I ordered imaging studies including N/A I independently visualized and interpreted imaging which showed NA I agree with the radiologist interpretation   Cardiac Monitoring:  The patient was maintained on a cardiac monitor.  I personally viewed and interpreted the cardiac monitored which showed an underlying rhythm of: N/A   Medicines ordered and prescription drug management:  I ordered medication including prep I have reviewed the patients home medicines and have made adjustments as needed  Critical Interventions:  N/A   Reevaluation:  Presents with possible exposure, patient would like to seek prophylactic treatment,  will provide him with prep, patient wants like to be tested for syphilis, GC chlamydia, hepatitis.  Consultations Obtained:  I requested consultation with the pharmacy John,  and discussed lab and imaging findings as well as pertinent plan - they recommend: Recommends ordering nonoccupational exposure order set, this will obtain proper lab workup and provide patient with initial treatment.    Test Considered:  Prophylactic treatment for STDs, patient would like to defer as he would like to wait for the results to come back, find that acceptable since he is not endorsing any symptoms at this time and does not believe that his partner has STI    Rule out Suspicion for UTI low at this time not endorsing any urinary symptoms.  I doubt epididymitis to endorse any testicular pain penile discharge.    Dispostion and problem list  After consideration of the diagnostic results and the patients response to treatment, I feel that the patent would benefit from discharge.  STD exposure-lab work has been obtained, patient started on PrEP, will follow-up with ID for further evaluation.            Final Clinical Impression(s) / ED Diagnoses Final diagnoses:  Exposure to STD    Rx / DC Orders ED Discharge  Orders          Ordered    elvitegravir-cobicistat-emtricitabine-tenofovir (GENVOYA) 150-150-200-10 MG TABS tablet  Daily with breakfast        12/29/22 0123    Ambulatory referral to Infectious Disease        12/29/22 0241              Carroll Sage, PA-C 12/29/22 0244    Mesner, Barbara Cower, MD 12/29/22 [phone]

## 2022-12-29 NOTE — Telephone Encounter (Signed)
ED RNCM attempted to contact patient at number listed in chart to assist with obtaining Advance accessing medication, CM will attempt tomorrow  4/25.

## 2022-12-29 NOTE — Discharge Instructions (Signed)
We have started you on prep which will treat you prophylactically for HIV, please take this medication as prescribed.  Your lab results are pending, you will find the results on your MyChart account.  If they test positive please follow-up with your primary doctor for further evaluation.  Please follow-up with infectious disease or your primary doctor for further management.  Come back to the emergency department if you develop chest pain, shortness of breath, severe abdominal pain, uncontrolled nausea, vomiting, diarrhea.

## 2022-12-29 NOTE — ED Triage Notes (Signed)
Pt requesting HIV test after possible exposure. Pt states condom broke and he is unsure if partner is HIV positive or not. Denies any symptoms.

## 2022-12-31 ENCOUNTER — Ambulatory Visit (INDEPENDENT_AMBULATORY_CARE_PROVIDER_SITE_OTHER): Payer: Medicaid Other | Admitting: Family Medicine

## 2022-12-31 ENCOUNTER — Other Ambulatory Visit: Payer: Self-pay | Admitting: Family Medicine

## 2022-12-31 ENCOUNTER — Encounter: Payer: Self-pay | Admitting: Family Medicine

## 2022-12-31 VITALS — BP 118/68 | HR 51 | Temp 98.0°F | Ht 70.0 in | Wt 219.1 lb

## 2022-12-31 DIAGNOSIS — E871 Hypo-osmolality and hyponatremia: Secondary | ICD-10-CM

## 2022-12-31 DIAGNOSIS — R7401 Elevation of levels of liver transaminase levels: Secondary | ICD-10-CM

## 2022-12-31 DIAGNOSIS — R053 Chronic cough: Secondary | ICD-10-CM

## 2022-12-31 LAB — COMPREHENSIVE METABOLIC PANEL
ALT: 55 U/L — ABNORMAL HIGH (ref 0–53)
AST: 27 U/L (ref 0–37)
Albumin: 4.2 g/dL (ref 3.5–5.2)
Alkaline Phosphatase: 70 U/L (ref 39–117)
BUN: 20 mg/dL (ref 6–23)
CO2: 26 mEq/L (ref 19–32)
Calcium: 9.2 mg/dL (ref 8.4–10.5)
Chloride: 103 mEq/L (ref 96–112)
Creatinine, Ser: 0.82 mg/dL (ref 0.40–1.50)
GFR: 124.13 mL/min (ref >60.00)
Glucose, Bld: 91 mg/dL (ref 70–99)
Potassium: 4.4 mEq/L (ref 3.5–5.1)
Sodium: 136 mEq/L (ref 135–145)
Total Bilirubin: 0.5 mg/dL (ref 0.2–1.2)
Total Protein: 8 g/dL (ref 6.0–8.3)

## 2022-12-31 MED ORDER — LEVOCETIRIZINE DIHYDROCHLORIDE 5 MG PO TABS
5.0000 mg | ORAL_TABLET | Freq: Every evening | ORAL | 2 refills | Status: DC
Start: 2022-12-31 — End: 2024-07-20

## 2022-12-31 MED ORDER — ELVITEG-COBIC-EMTRICIT-TENOFAF 150-150-200-10 MG PO TABS: 1.0000 | ORAL_TABLET | Freq: Every day | ORAL | 0 refills | Status: DC

## 2022-12-31 MED ORDER — ALBUTEROL SULFATE HFA 108 (90 BASE) MCG/ACT IN AERS
2.0000 | INHALATION_SPRAY | Freq: Four times a day (QID) | RESPIRATORY_TRACT | 0 refills | Status: AC | PRN
Start: 2022-12-31 — End: ?

## 2022-12-31 NOTE — Patient Instructions (Signed)
Give [place] 2-3 business days to get the results of your labs back.   Claritin (loratadine), Allegra (fexofenadine), Zyrtec (cetirizine) which is also equivalent to Xyzal (levocetirizine); these are listed in order from weakest to strongest. Generic, and therefore cheaper, options are in the parentheses.   Flonase (fluticasone); nasal spray that is over the counter. 2 sprays each nostril, once daily. Aim towards the same side eye when you spray.  There are available OTC, and the generic versions, which may be cheaper, are in parentheses. Show this to a pharmacist if you have trouble finding any of these items.  We will place a digital reminder to recheck HIV testing in 6 months.   Let us know if you need anything.

## 2022-12-31 NOTE — Progress Notes (Signed)
Chief Complaint  Patient presents with   Results    labs   Cough    Subjective: Patient is a 22 y.o. male here for ED f/u.  Duration: 2 months  Associated symptoms: Worse at night, comes when the temperature changes.  Denies: rhinorrhea, wheezing, shortness of breath, myalgia, and fevers Treatment to date: none Comes and goes.  Sick contacts: Yes; sometimes will cough History of childhood asthma.  Not currently taking anything.  Past Medical History:  Diagnosis Date   Asthma    as a child, no problems as an adult     Objective: BP 118/68 (BP Location: Left Arm, Patient Position: Sitting, Cuff Size: Normal)   Pulse (!) 51   Temp 98 F (36.7 C) (Oral)   Ht 5\' 10"  (1.778 m)   Wt 219 lb 2 oz (99.4 kg)   SpO2 98%   BMI 31.44 kg/m  General: Awake, appears stated age HEENT: Canals are patent without otorrhea, TMs negative, nares are patent without rhinorrhea, turbinates are edematous on the right more than left, no sinus TTP Heart: RRR, no LE edema Lungs: CTAB, no rales, wheezes or rhonchi. No accessory muscle use Psych: Age appropriate judgment and insight, normal affect and mood  Assessment and Plan: Hyponatremia - Plan: Comprehensive metabolic panel  Elevated ALT measurement - Plan: Comprehensive metabolic panel  Chronic cough - Plan: albuterol (VENTOLIN HFA) 108 (90 Base) MCG/ACT inhaler, levocetirizine (XYZAL) 5 MG tablet  1/2.  Follow-up in ED labs.  If LFTs are still elevated, will check a right upper quadrant ultrasound. 3.  Chronic, not currently controlled.  With a cough exacerbated by changes in the weather, concern for underlying allergies/asthma etiology.  Short acting beta agonist as needed and oral antihistamine recommended.  Does not want to do a nasal spray.  Follow-up in 1 month to recheck this.  Could consider Singulair versus inhaled corticosteroid. The patient voiced understanding and agreement to the plan.  Casey Roche Naylor, DO 12/31/22  11:50  AM

## 2023-01-15 ENCOUNTER — Ambulatory Visit (HOSPITAL_BASED_OUTPATIENT_CLINIC_OR_DEPARTMENT_OTHER): Admission: RE | Admit: 2023-01-15 | Payer: Medicaid Other | Source: Ambulatory Visit

## 2023-01-20 ENCOUNTER — Ambulatory Visit (HOSPITAL_BASED_OUTPATIENT_CLINIC_OR_DEPARTMENT_OTHER): Admission: RE | Admit: 2023-01-20 | Payer: Medicaid Other | Source: Ambulatory Visit

## 2023-01-26 ENCOUNTER — Other Ambulatory Visit: Payer: Self-pay | Admitting: Family Medicine

## 2023-06-07 ENCOUNTER — Other Ambulatory Visit: Payer: Self-pay | Admitting: Family Medicine

## 2023-06-07 DIAGNOSIS — R7401 Elevation of levels of liver transaminase levels: Secondary | ICD-10-CM

## 2023-06-07 DIAGNOSIS — Z114 Encounter for screening for human immunodeficiency virus [HIV]: Secondary | ICD-10-CM

## 2023-06-07 NOTE — Addendum Note (Signed)
Addended by: Scharlene Gloss B on: 06/07/2023 09:03 AM   Modules accepted: Orders

## 2023-06-20 ENCOUNTER — Encounter: Payer: Self-pay | Admitting: Family Medicine

## 2023-06-20 ENCOUNTER — Other Ambulatory Visit (INDEPENDENT_AMBULATORY_CARE_PROVIDER_SITE_OTHER): Payer: Medicaid Other

## 2023-06-20 DIAGNOSIS — Z114 Encounter for screening for human immunodeficiency virus [HIV]: Secondary | ICD-10-CM

## 2023-06-20 DIAGNOSIS — R7401 Elevation of levels of liver transaminase levels: Secondary | ICD-10-CM | POA: Diagnosis not present

## 2023-06-20 LAB — HEPATIC FUNCTION PANEL
ALT: 38 U/L (ref 0–53)
AST: 17 U/L (ref 0–37)
Albumin: 4.3 g/dL (ref 3.5–5.2)
Alkaline Phosphatase: 77 U/L (ref 39–117)
Bilirubin, Direct: 0.1 mg/dL (ref 0.0–0.3)
Total Bilirubin: 0.4 mg/dL (ref 0.2–1.2)
Total Protein: 8 g/dL (ref 6.0–8.3)

## 2023-06-21 ENCOUNTER — Other Ambulatory Visit: Payer: Medicaid Other

## 2023-06-21 LAB — HIV ANTIBODY (ROUTINE TESTING W REFLEX): HIV 1&2 Ab, 4th Generation: NONREACTIVE

## 2023-08-27 ENCOUNTER — Other Ambulatory Visit: Payer: Self-pay

## 2023-08-27 ENCOUNTER — Ambulatory Visit (HOSPITAL_COMMUNITY)
Admission: EM | Admit: 2023-08-27 | Discharge: 2023-08-27 | Disposition: A | Discharge status: Home / Self Care | Payer: Medicaid Other | Attending: Emergency Medicine | Admitting: Emergency Medicine

## 2023-08-27 DIAGNOSIS — L0291 Cutaneous abscess, unspecified: Secondary | ICD-10-CM | POA: Diagnosis not present

## 2023-08-27 MED ORDER — AMOXICILLIN-POT CLAVULANATE 875-125 MG PO TABS: 1.0000 | ORAL_TABLET | Freq: Two times a day (BID) | ORAL | 0 refills | Status: DC

## 2023-08-27 NOTE — ED Provider Notes (Signed)
[facility]    CSN: [id number] Arrival date & time: 08/27/23  1016      History   Chief Complaint Chief Complaint  Patient presents with   Skin Problem   Abscess    HPI Casey David is a 22 y.o. male.   Patient presents to clinic for a painful area above his right upper lip and below his nose.  Area is tender, crusted and swollen.  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  He has not really tried any interventions for this.  He does have Neosporin and topical mupirocin ointment at home, reports the ointment does not really work for him.  He did pick at the area little, and it has gotten worse.  No fevers. No facial swelling.   The history is provided by the patient and medical records.  Abscess   Past Medical History:  Diagnosis Date   Asthma    as a child, no problems as an adult     Patient Active Problem List   Diagnosis Date Noted   Chronic cough 12/31/2022   Onychomycosis 02/06/2021    Past Surgical History:  Procedure Laterality Date   HAND SURGERY Right    index finger surgery   OPEN REDUCTION INTERNAL FIXATION (ORIF) DISTAL RADIAL FRACTURE Left 11/06/2020   Procedure: OPEN REDUCTION INTERNAL FIXATION (ORIF) DISTAL RADIAL AND ULNAR FRACTURE, NON OPERATIVE TREATMENT TO RIGHT NONDISPLACED FOURTH MIDDLE PHALANX FRACTURE;  Surgeon: Ernest Mallick, MD;  Location: [facility] OR;  Service: Orthopedics;  Laterality: Left;  needs 90 minutes       Home Medications    Prior to Admission medications   Medication Sig Start Date End Date Taking? Authorizing Provider  amoxicillin-clavulanate (AUGMENTIN) 875-125 MG tablet Take 1 tablet by mouth every 12 (twelve) hours. 08/27/23  Yes Rinaldo Ratel, [place] N, FNP  albuterol (VENTOLIN HFA) 108 (90 Base) MCG/ACT inhaler Inhale 2 puffs into the lungs every 6 (six) hours as needed for wheezing or shortness of breath. 12/31/22   Wendling, Jilda Roche, DO  GENVOYA 150-150-200-10 MG  TABS tablet TAKE 1 TABLET BY MOUTH DAILY WITH BREAKFAST 01/26/23   Wendling, Jilda Roche, DO  levocetirizine (XYZAL) 5 MG tablet Take 1 tablet (5 mg total) by mouth every evening. 12/31/22   Sharlene Dory, DO  terbinafine (LAMISIL) 250 MG tablet Take 1 tablet (250 mg total) by mouth daily. 12/25/21   Sharlene Dory, DO    Family History Family History  Problem Relation Age of Onset   Healthy Mother    Healthy Father    Heart disease Neg Hx    Cancer Neg Hx     Social History Social History   Tobacco Use   Smoking status: Never   Smokeless tobacco: Never  Vaping Use   Vaping status: Never Used  Substance Use Topics   Alcohol use: Never   Drug use: Never     Allergies   Patient has no known allergies.   Review of Systems Review of Systems  Per HPI   Physical Exam Triage Vital Signs ED Triage Vitals  Encounter Vitals Group     BP 08/27/23 1048 114/79     Systolic BP Percentile --      Diastolic BP Percentile --      Pulse Rate 08/27/23 1048 71     Resp 08/27/23 1048 20     Temp 08/27/23 1050 98.4 F (36.9 C)     Temp Source 08/27/23  1050 Oral     SpO2 08/27/23 1048 98 %     Weight 08/27/23 1050 205 lb (93 kg)     Height 08/27/23 1050 5\' 9"  (1.753 m)     Head Circumference --      Peak Flow --      Pain Score 08/27/23 1049 3     Pain Loc --      Pain Education --      Exclude from Growth Chart --    No data found.  Updated Vital Signs BP 114/79 (BP Location: Left Arm)   Pulse 71   Temp 98.4 F (36.9 C) (Oral)   Resp 20   Ht 5\' 9"  (1.753 m)   Wt 205 lb (93 kg)   SpO2 98%   BMI 30.27 kg/m   Visual Acuity Right Eye Distance:   Left Eye Distance:   Bilateral Distance:    Right Eye Near:   Left Eye Near:    Bilateral Near:     Physical Exam Vitals and nursing note reviewed.  Constitutional:      Appearance: Normal appearance.  HENT:     Head: Normocephalic and atraumatic.      Comments: Tender area above right lip that  has some crusted serosanguinous drainage.     Right Ear: External ear normal.     Left Ear: External ear normal.     Nose: Nose normal.     Mouth/Throat:     Mouth: Mucous membranes are moist.  Eyes:     Conjunctiva/sclera: Conjunctivae normal.  Cardiovascular:     Rate and Rhythm: Normal rate.  Pulmonary:     Effort: Pulmonary effort is normal. No respiratory distress.  Musculoskeletal:        General: Normal range of motion.  Skin:    General: Skin is warm and dry.  Neurological:     General: No focal deficit present.     Mental Status: He is alert and oriented to person, place, and time.  Psychiatric:        Mood and Affect: Mood normal.        Behavior: Behavior normal. Behavior is cooperative.      UC Treatments / Results  Labs (all labs ordered are listed, but only abnormal results are displayed) Labs Reviewed - No data to display  EKG   Radiology No results found.  Procedures Procedures (including critical care time)  Medications Ordered in UC Medications - No data to display  Initial Impression / Assessment and Plan / UC Course  I have reviewed the triage vital signs and the nursing notes.  Pertinent labs & imaging results that were available during my care of the patient were reviewed by me and considered in my medical decision making (see chart for details).  Vitals and triage reviewed, patient is hemodynamically stable.  Has a small lesion above his right upper lip that is tender with some crusted drainage.  Reports history of recurrent abscesses in this area and picking at them.  Discussed that does not seem like a cellulitis at this point and that he would benefit from warm compresses and topical antibiotics.  Patient is concerned that the area may get worse and since it is Christmas he does not have time to return to clinic.  Will send him paper prescription for Augmentin to use if the area gets larger, more painful or evolves in any way.  Plan of care,  follow-up care and return precautions given, no questions at  this time.     Final Clinical Impressions(s) / UC Diagnoses   Final diagnoses:  Abscess     Discharge Instructions      Please use warm compresses to the area with antibacterial solution like Dial soap or Hibiclens.  Avoid picking at the area as this will increase your risk of infection.  If the area becomes larger, hot, swollen or more painful you can start the oral antibiotics twice daily with food.  Return to clinic for any new or urgent symptoms.    ED Prescriptions     Medication Sig Dispense Auth. Provider   amoxicillin-clavulanate (AUGMENTIN) 875-125 MG tablet Take 1 tablet by mouth every 12 (twelve) hours. 14 tablet Karsen Nakanishi, [place] N, [place]      PDMP not reviewed this encounter.   Boleslaus Holloway, [place] N, [place] 08/27/23 [phone]

## 2023-08-27 NOTE — Discharge Instructions (Signed)
Please use warm compresses to the area with antibacterial solution like Dial soap or Hibiclens.  Avoid picking at the area as this will increase your risk of infection.  If the area becomes larger, hot, swollen or more painful you can start the oral antibiotics twice daily with food.  Return to clinic for any new or urgent symptoms.

## 2023-08-27 NOTE — ED Triage Notes (Signed)
Arrives with complaints of abscess to face x2 days. Would like to try an antibiotic. Rates pain at site a 3/10. Reports no fevers.

## 2024-01-04 ENCOUNTER — Ambulatory Visit: Admitting: Family Medicine

## 2024-01-10 ENCOUNTER — Other Ambulatory Visit: Payer: Self-pay | Admitting: Family Medicine

## 2024-01-10 ENCOUNTER — Encounter: Payer: Self-pay | Admitting: Family Medicine

## 2024-01-10 ENCOUNTER — Ambulatory Visit: Admitting: Family Medicine

## 2024-01-10 VITALS — BP 124/76 | HR 69 | Temp 98.0°F | Resp 16 | Ht 69.0 in | Wt 205.0 lb

## 2024-01-10 DIAGNOSIS — R7401 Elevation of levels of liver transaminase levels: Secondary | ICD-10-CM

## 2024-01-10 DIAGNOSIS — L0292 Furuncle, unspecified: Secondary | ICD-10-CM

## 2024-01-10 DIAGNOSIS — B351 Tinea unguium: Secondary | ICD-10-CM | POA: Diagnosis not present

## 2024-01-10 LAB — HEPATIC FUNCTION PANEL
ALT: 67 U/L — ABNORMAL HIGH (ref 0–53)
AST: 32 U/L (ref 0–37)
Albumin: 4.6 g/dL (ref 3.5–5.2)
Alkaline Phosphatase: 74 U/L (ref 39–117)
Bilirubin, Direct: 0.1 mg/dL (ref 0.0–0.3)
Total Bilirubin: 0.6 mg/dL (ref 0.2–1.2)
Total Protein: 8 g/dL (ref 6.0–8.3)

## 2024-01-10 MED ORDER — TERBINAFINE HCL 250 MG PO TABS: 250.0000 mg | ORAL_TABLET | Freq: Every day | ORAL | 2 refills | Status: DC

## 2024-01-10 NOTE — Progress Notes (Signed)
 Chief Complaint  Patient presents with   Mass    Mass/     Casey David is a 23 y.o. male here for a skin complaint.  Duration: 2 months Location: groin Pruritic? Yes but not currently Painful? No Drainage? No New soaps/lotions/topicals/detergents? No Sick contacts? No Other associated symptoms: no redness, fevers Therapies tried thus far: Hibiclens   L great toenail still with fungus. It did improve but he stopped taking the Lamisil  250 mg/d. Was compliant, no AE's. No pain or redness of skin around the nail.   Past Medical History:  Diagnosis Date   Asthma    as a child, no problems as an adult     BP 124/76 (BP Location: Left Arm, Patient Position: Sitting)   Pulse 69   Temp 98 F (36.7 C) (Oral)   Resp 16   Ht 5\' 9"  (1.753 m)   Wt 205 lb (93 kg)   SpO2 98%   BMI 30.27 kg/m  Gen: awake, alert, appearing stated age Lungs: No accessory muscle use Skin: over mons pubis region, there are scaly papules at base of hairs x 4. No drainage, erythema, TTP, fluctuance, excoriation Psych: Age appropriate judgment and insight  Furuncle  Onychomycosis - Plan: terbinafine  (LAMISIL ) 250 MG tablet, Hepatic function panel  Reassurance, consider bleach bath that is diluted. Instructions provided today. TAO for prn usage. Consider avoiding  Chroinc, unstable. Ck labs. Restart Lamisil  250 mg/d.  F/u prn. The patient voiced understanding and agreement to the plan.  Shellie Dials Scanlon, DO 01/10/24 9:53 AM

## 2024-01-10 NOTE — Patient Instructions (Addendum)
 Consider triple antibiotic ointment if this happens again.  Shaving can precede this.   To prepare a bleach bath, one-fourth to one-half cup of bleach is placed in a full bathtub (about 40 gallons) of water. Bleach baths are usually taken for 5 to 10 minutes twice per week and should be followed by application of an emollient.  Give us  2-3 business days to get the results of your labs back.   Let us  know if you need anything.

## 2024-01-14 ENCOUNTER — Ambulatory Visit (HOSPITAL_BASED_OUTPATIENT_CLINIC_OR_DEPARTMENT_OTHER)
Admission: RE | Admit: 2024-01-14 | Discharge: 2024-01-14 | Disposition: A | Discharge status: Home / Self Care | Source: Ambulatory Visit | Attending: Family Medicine | Admitting: Family Medicine

## 2024-01-14 DIAGNOSIS — R7401 Elevation of levels of liver transaminase levels: Secondary | ICD-10-CM | POA: Diagnosis present

## 2024-01-15 ENCOUNTER — Encounter: Payer: Self-pay | Admitting: Family Medicine

## 2024-01-16 ENCOUNTER — Other Ambulatory Visit: Payer: Self-pay

## 2024-01-16 DIAGNOSIS — R748 Abnormal levels of other serum enzymes: Secondary | ICD-10-CM

## 2024-01-18 ENCOUNTER — Ambulatory Visit: Payer: Self-pay

## 2024-01-18 NOTE — Addendum Note (Signed)
 Addended by: Sricharan Lacomb M on: 01/18/2024 01:06 PM   Modules accepted: Orders

## 2024-01-27 ENCOUNTER — Other Ambulatory Visit (INDEPENDENT_AMBULATORY_CARE_PROVIDER_SITE_OTHER)

## 2024-01-27 ENCOUNTER — Ambulatory Visit: Payer: Self-pay | Admitting: Family Medicine

## 2024-01-27 ENCOUNTER — Other Ambulatory Visit: Payer: Self-pay

## 2024-01-27 DIAGNOSIS — R748 Abnormal levels of other serum enzymes: Secondary | ICD-10-CM | POA: Diagnosis not present

## 2024-01-27 LAB — HEPATIC FUNCTION PANEL
ALT: 75 U/L — ABNORMAL HIGH (ref 0–53)
AST: 32 U/L (ref 0–37)
Albumin: 4.4 g/dL (ref 3.5–5.2)
Alkaline Phosphatase: 58 U/L (ref 39–117)
Bilirubin, Direct: 0.1 mg/dL (ref 0.0–0.3)
Total Bilirubin: 0.5 mg/dL (ref 0.2–1.2)
Total Protein: 7.7 g/dL (ref 6.0–8.3)

## 2024-02-27 ENCOUNTER — Other Ambulatory Visit

## 2024-05-16 ENCOUNTER — Ambulatory Visit: Admitting: Family Medicine

## 2024-07-20 ENCOUNTER — Encounter (HOSPITAL_COMMUNITY): Payer: Self-pay

## 2024-07-20 ENCOUNTER — Ambulatory Visit (HOSPITAL_COMMUNITY)
Admission: EM | Admit: 2024-07-20 | Discharge: 2024-07-20 | Disposition: A | Discharge status: Home / Self Care | Attending: Physician Assistant | Admitting: Physician Assistant

## 2024-07-20 ENCOUNTER — Ambulatory Visit (HOSPITAL_COMMUNITY)

## 2024-07-20 ENCOUNTER — Ambulatory Visit (HOSPITAL_COMMUNITY): Payer: Self-pay | Admitting: Physician Assistant

## 2024-07-20 DIAGNOSIS — L089 Local infection of the skin and subcutaneous tissue, unspecified: Secondary | ICD-10-CM | POA: Diagnosis not present

## 2024-07-20 DIAGNOSIS — M79644 Pain in right finger(s): Secondary | ICD-10-CM

## 2024-07-20 MED ORDER — SULFAMETHOXAZOLE-TRIMETHOPRIM 800-160 MG PO TABS: 1.0000 | ORAL_TABLET | Freq: Two times a day (BID) | ORAL | 0 refills | Status: AC

## 2024-07-20 MED ORDER — AMOXICILLIN-POT CLAVULANATE 875-125 MG PO TABS: 1.0000 | ORAL_TABLET | Freq: Two times a day (BID) | ORAL | 0 refills | Status: AC

## 2024-07-20 NOTE — ED Provider Notes (Signed)
 [facility]    CSN: [id number] Arrival date & time: 07/20/24  1930      History   Chief Complaint Chief Complaint  Patient presents with   Finger Injury    HPI Casey David is a 23 y.o. male.   Patient presents today with a several day history of pain in his right distal ring finger.  He denies any known trauma or injury.  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  He does not know if he injured himself at this time as he did not have immediate pain but that is the only thing that he can think of that could have triggered his symptoms.  He denies any splinter or wound involving his finger.  He reports that the pain is rated 8 on a 0 to 10 pain scale, described as throbbing, worse with palpation of the region, no relieving factors identified.  He denies any numbness or paresthesias.  Denies any fever, nausea, vomiting.  He has noticed a slight discoloration and is concerned that there might be pus in the finger and it might be infected.  He has not had any drainage.  He does report having a ligament repair when he was very young and this finger and as a result has had incomplete extension of the distal finger since childhood.  He does report that he is followed by hand surgery as he had an injury to his left hand several months ago but has not seen them for this condition.  Denies any history of diabetes or immunosuppression.  Denies history of MRSA or recurrent skin infections.    Past Medical History:  Diagnosis Date   Asthma    as a child, no problems as an adult     Patient Active Problem List   Diagnosis Date Noted   Chronic cough 12/31/2022   Onychomycosis 02/06/2021    Past Surgical History:  Procedure Laterality Date   HAND SURGERY Right    index finger surgery   OPEN REDUCTION INTERNAL FIXATION (ORIF) DISTAL RADIAL FRACTURE Left 11/06/2020   Procedure: OPEN REDUCTION  INTERNAL FIXATION (ORIF) DISTAL RADIAL AND ULNAR FRACTURE, NON OPERATIVE TREATMENT TO RIGHT NONDISPLACED FOURTH MIDDLE PHALANX FRACTURE;  Surgeon: Carolee Lynwood JINNY DOUGLAS, MD;  Location: [facility] OR;  Service: Orthopedics;  Laterality: Left;  needs 90 minutes       Home Medications    Prior to Admission medications   Medication Sig Start Date End Date Taking? Authorizing Provider  amoxicillin -clavulanate (AUGMENTIN ) 875-125 MG tablet Take 1 tablet by mouth every 12 (twelve) hours. 07/20/24  Yes Mikisha Roseland K, PA-C  sulfamethoxazole-trimethoprim (BACTRIM DS) 800-160 MG tablet Take 1 tablet by mouth 2 (two) times daily for 7 days. 07/20/24 07/27/24 Yes Lawan Nanez, Rocky POUR, PA-C  albuterol  (VENTOLIN  HFA) 108 (90 Base) MCG/ACT inhaler Inhale 2 puffs into the lungs every 6 (six) hours as needed for wheezing or shortness of breath. 12/31/22   Wendling, Mabel Mt, DO  GENVOYA  150-150-200-10 MG TABS tablet TAKE 1 TABLET BY MOUTH DAILY WITH BREAKFAST Patient not taking: Reported on 07/20/2024 01/26/23   Frann Mabel Mt, DO    Family History Family History  Problem Relation Age of Onset   Healthy Mother    Healthy Father    Heart disease Neg Hx    Cancer Neg Hx     Social History Social History   Tobacco Use   Smoking  status: Never   Smokeless tobacco: Never  Vaping Use   Vaping status: Never Used  Substance Use Topics   Alcohol use: Never    Comment: occasionally   Drug use: Never     Allergies   Patient has no known allergies.   Review of Systems Review of Systems  Constitutional:  Positive for activity change. Negative for appetite change, fatigue and fever.  Respiratory:  Negative for cough and shortness of breath.   Cardiovascular:  Negative for chest pain.  Gastrointestinal:  Negative for diarrhea, nausea and vomiting.  Musculoskeletal:  Positive for arthralgias and joint swelling. Negative for myalgias.  Skin:  Positive for color change and wound.  Neurological:   Negative for weakness and numbness.     Physical Exam Triage Vital Signs ED Triage Vitals  Encounter Vitals Group     BP 07/20/24 2019 134/89     Girls Systolic BP Percentile --      Girls Diastolic BP Percentile --      Boys Systolic BP Percentile --      Boys Diastolic BP Percentile --      Pulse Rate 07/20/24 2019 89     Resp 07/20/24 2019 16     Temp 07/20/24 2019 98.2 F (36.8 C)     Temp Source 07/20/24 2019 Oral     SpO2 07/20/24 2019 94 %     Weight --      Height --      Head Circumference --      Peak Flow --      Pain Score 07/20/24 2018 8     Pain Loc --      Pain Education --      Exclude from Growth Chart --    No data found.  Updated Vital Signs BP 134/89 (BP Location: Right Arm)   Pulse 89   Temp 98.2 F (36.8 C) (Oral)   Resp 16   SpO2 94%   Visual Acuity Right Eye Distance:   Left Eye Distance:   Bilateral Distance:    Right Eye Near:   Left Eye Near:    Bilateral Near:     Physical Exam Vitals reviewed.  Constitutional:      General: He is awake.     Appearance: Normal appearance. He is well-developed. He is not ill-appearing.     Comments: Very pleasant male appears stated age in no acute distress sitting comfortably in exam room  HENT:     Head: Normocephalic and atraumatic.     Mouth/Throat:     Pharynx: No oropharyngeal exudate, posterior oropharyngeal erythema or uvula swelling.  Cardiovascular:     Rate and Rhythm: Normal rate and regular rhythm.     Heart sounds: Normal heart sounds, S1 normal and S2 normal. No murmur heard.    Comments: Capillary fill within 2 seconds right fingers. Pulmonary:     Effort: Pulmonary effort is normal.     Breath sounds: Normal breath sounds. No stridor. No wheezing, rhonchi or rales.     Comments: Clear to auscultation bilaterally Musculoskeletal:     Right hand: Swelling and tenderness present. No bony tenderness. Decreased range of motion. There is no disruption of two-point discrimination.  Normal capillary refill.     Comments: Right ring finger: Tender to palpation distal right finger with associated erythema and swelling.  Finger is neurovascularly intact.  No active wound or bleeding noted.  Area of tenderness is significantly indurated without fluctuance or obvious fluid collection.  Decreased extension of distal finger with healed surgical scar over dorsal DIP joint.  Skin:    Findings: Erythema present.     Comments: Erythema noted radial lateral edge of right ring finger.  No wound noted.  No streaking or evidence of lymphangitis.  Neurological:     Mental Status: He is alert.  Psychiatric:        Behavior: Behavior is cooperative.      UC Treatments / Results  Labs (all labs ordered are listed, but only abnormal results are displayed) Labs Reviewed - No data to display  EKG   Radiology DG Finger Ring Right Result Date: 07/20/2024 EXAM: 3 VIEW(S) XRAY OF THE FINGER(S) 07/20/2024 08:49:49 PM COMPARISON: X-ray right hand 11/01/2020. CLINICAL HISTORY: pain and swelling FINDINGS: BONES AND JOINTS: Flexion at the distal interphalangeal joint. Healed middle phalangeal body fracture. No acute fracture. No dislocation. No focal osseous lesion. SOFT TISSUES: Diffuse soft tissue swelling. Chronic Linear 1 mm radiopaque foreign body along the dorsal aspect of the distal interphalangeal joint. IMPRESSION: 1. No acute fracture or dislocation. 2. Chronic linear 1 mm radiopaque foreign body along the dorsal aspect of the fourth digit distal interphalangeal joint, with diffuse soft tissue swelling. 3. Flexion at the distal interphalangeal joint. Electronically signed by: Morgane Naveau MD 07/20/2024 08:58 PM EST RP Workstation: [id number]    Procedures Procedures (including critical care time)  Medications Ordered in UC Medications - No data to display  Initial Impression / Assessment and Plan / UC Course  I have reviewed the triage vital signs and the nursing  notes.  Pertinent labs & imaging results that were available during my care of the patient were reviewed by me and considered in my medical decision making (see chart for details).     Patient is well-appearing, afebrile, nontoxic, nontachycardic.  His finger is neurovascularly intact.  X-ray was obtained that showed no acute osseous abnormality but small foreign body.  This is not in the area where is he having significant pain but I did discuss this finding with the patient based on my wet read.  Radiologist over read was consistent and so I sent him a MyChart message but we will continue her current treatment plan.  We will treat for an infection given clinical presentation so he was started on Augmentin  as well as Bactrim DS.  No indication for dose adjustment based on his metabolic panel from 12/31/2022 with creatinine of 0.82 and calculated creatinine clearance 183 mL/min; patient has no risk factors for significant CKD so metabolic panel was not repeated.  We discussed that if he develops any rash or oral lesions she should stop the medication and be seen immediately.  Recommended close follow-up with hand surgery and he was given the contact information for provider on-call with instruction to call to schedule an appointment first thing next week.  We discussed that if he has any changing or worsening symptoms including numbness or paresthesias in the finger, increasing pain, swelling, fever needs to be seen emergently.  Return precautions given.  Patient expressed understanding and agreement with treatment plan.  Final Clinical Impressions(s) / UC Diagnoses   Final diagnoses:  Pain of finger of right hand  Finger infection     Discharge Instructions      I did not see any broken bone in your x-ray.  There is what looks to be a possible foreign body but I suspect this is related to your previous hand surgery.  I am concerned that there might  be an infection so please start Augmentin  twice  daily for 7 days as well as Bactrim DS.  If you develop any rash or lesions stop the medication to be seen immediately.  I would like you to follow-up with a hand specialist to soon as possible.  Call them to schedule an appointment.  If you have any fever, increasing pain, numbness or tingling in the finger, spread of pain and redness I would like you to go to the emergency room immediately.     ED Prescriptions     Medication Sig Dispense Auth. Provider   amoxicillin -clavulanate (AUGMENTIN ) 875-125 MG tablet Take 1 tablet by mouth every 12 (twelve) hours. 14 tablet Izeyah Deike K, PA-C   sulfamethoxazole-trimethoprim (BACTRIM DS) 800-160 MG tablet Take 1 tablet by mouth 2 (two) times daily for 7 days. 14 tablet Jerric Oyen K, PA-C      PDMP not reviewed this encounter.   Sherrell Rocky POUR, PA-C 07/20/24 2110

## 2024-07-20 NOTE — ED Triage Notes (Signed)
 Patient here today with c/o right ring finger pain and swelling after grabbing something really tight on Monday. Patient states that the pain started on Tuesday. Patient also states that he had some ligament repair surgery in the same finger when he was little from a basketball injury. Patient thinks that he has an infection in his finger.

## 2024-07-20 NOTE — Discharge Instructions (Signed)
 I did not see any broken bone in your x-ray.  There is what looks to be a possible foreign body but I suspect this is related to your previous hand surgery.  I am concerned that there might be an infection so please start Augmentin  twice daily for 7 days as well as Bactrim DS.  If you develop any rash or lesions stop the medication to be seen immediately.  I would like you to follow-up with a hand specialist to soon as possible.  Call them to schedule an appointment.  If you have any fever, increasing pain, numbness or tingling in the finger, spread of pain and redness I would like you to go to the emergency room immediately.

## 2024-07-21 ENCOUNTER — Other Ambulatory Visit: Payer: Self-pay

## 2024-07-21 ENCOUNTER — Encounter (HOSPITAL_COMMUNITY): Payer: Self-pay

## 2024-07-21 ENCOUNTER — Emergency Department (HOSPITAL_COMMUNITY)
Admission: EM | Admit: 2024-07-21 | Discharge: 2024-07-22 | Disposition: A | Discharge status: Home / Self Care | Attending: Emergency Medicine | Admitting: Emergency Medicine

## 2024-07-21 DIAGNOSIS — L03011 Cellulitis of right finger: Secondary | ICD-10-CM | POA: Insufficient documentation

## 2024-07-21 DIAGNOSIS — L03019 Cellulitis of unspecified finger: Secondary | ICD-10-CM

## 2024-07-21 MED ORDER — LIDOCAINE HCL (PF) 1 % IJ SOLN
30.0000 mL | Freq: Once | INTRAMUSCULAR | Status: AC
  Administered 2024-07-21: 30 mL
  Filled 2024-07-21: qty 30

## 2024-07-21 NOTE — ED Notes (Signed)
Tray set up at bedside 

## 2024-07-21 NOTE — ED Triage Notes (Addendum)
 Pt presents with redness and swelling to right ring finger that he noticed on Tuesday. There appears to be a pocket of pus. He went to Urgent Care yesterday and was prescribed abx. He is requesting for his finger to be drained. Xray done yesterday at urgent care

## 2024-07-22 NOTE — Discharge Instructions (Signed)
 Continue taking your antibiotics as previously prescribed.  Return for new or worsening symptoms.

## 2024-07-22 NOTE — ED Notes (Signed)
 Wrapped finger with coband

## 2024-07-22 NOTE — ED Provider Notes (Signed)
 [facility] Emergency Department Provider Note MRN:  [id number]  Arrival date & time: 07/22/24     Chief Complaint   Finger Infection   History of Present Illness   Casey David is a 23 y.o. year-old male presents to the ED with chief complaint of right finger infection.  Was started on Augmentin  and Bactrim by urgent care, but symptoms worsened.  Patient states that he does bite his nails.  States that his finger has continued to swell.  He denies fevers or chills.  States that it is hard to make a fist because of the swelling.  Denies any other associated symptoms..  History provided by patient.   Review of Systems  Pertinent positive and negative review of systems noted in HPI.    Physical Exam   Vitals:   07/21/24 2224  BP: (!) 173/111  Pulse: 73  Resp: 18  Temp: 98.1 F (36.7 C)  SpO2: 98%    CONSTITUTIONAL:  non toxic-appearing, NAD NEURO:  Alert and oriented x 3, CN 3-12 grossly intact EYES:  eyes equal and reactive ENT/NECK:  Supple, no stridor  CARDIO:  normal rate, regular rhythm, appears well-perfused  PULM:  No respiratory distress GI/GU:  non-distended,  MSK/SPINE:  No gross deformities, no edema, moves all extremities  SKIN:  no rash, atraumatic, diffuse swelling of the distal right ring finger with TTP   *Additional and/or pertinent findings included in MDM below  Diagnostic and Interventional Summary    EKG Interpretation Date/Time:    Ventricular Rate:    PR Interval:    QRS Duration:    QT Interval:    QTC Calculation:   R Axis:      Text Interpretation:         Labs Reviewed - No data to display  No orders to display    Medications  lidocaine  (PF) (XYLOCAINE ) 1 % injection 30 mL (30 mLs Infiltration Given 07/21/24 2359)     Procedures  /  Critical Care .Incision and Drainage  Date/Time: 07/22/2024 12:26 AM  Performed by: Vicky Charleston, PA-C Authorized by: Vicky Charleston, PA-C   Consent:     Consent obtained:  Verbal   Consent given by:  Patient   Risks discussed:  Bleeding, incomplete drainage, pain and infection   Alternatives discussed:  No treatment and delayed treatment Universal protocol:    Procedure explained and questions answered to patient or proxy's satisfaction: yes     Relevant documents present and verified: yes     Test results available : yes     Imaging studies available: yes     Required blood products, implants, devices, and special equipment available: yes     Site/side marked: yes     Immediately prior to procedure, a time out was called: yes     Patient identity confirmed:  Verbally with patient Location:    Type:  Abscess   Size:  1cm   Location:  Upper extremity   Upper extremity location:  Finger   Finger location:  R ring finger Pre-procedure details:    Skin preparation:  Chlorhexidine  with alcohol Sedation:    Sedation type:  None Anesthesia:    Anesthesia method:  None Procedure type:    Complexity:  Simple Procedure details:    Ultrasound guidance: no     Needle aspiration: no     Incision types:  Single straight   Wound management:  Probed and deloculated   Drainage amount:  Moderate  Wound treatment:  Wound left open   Packing materials:  1/4 in iodoform gauze Post-procedure details:    Procedure completion:  Tolerated well, no immediate complications   ED Course and Medical Decision Making  I have reviewed the triage vital signs, the nursing notes, and pertinent available records from the EMR.  Social Determinants Affecting Complexity of Care: Patient has no clinically significant social determinants affecting this chief complaint..   ED Course:    Medical Decision Making Patient here with right ring finger infection that appears consistent with a felon.  He has been taking Augmentin  and Bactrim prescribed by urgent care without much improvement.  He states that he continues to have pain.  He does not have pain with  passive extension.  My suspicion for flexor tenosynovitis is low.  He does bite his nails.  I suspect that he developed paronychia that worsened into a felon.  Risk Prescription drug management.         Consultants: I consulted with Dr. Lorretta, who recommends bedside I&D and can follow up in the office next week.   Treatment and Plan: I considered admission due to patient's initial presentation, but after considering the examination and diagnostic results, patient will not require admission and can be discharged with outpatient follow-up.    Final Clinical Impressions(s) / ED Diagnoses     ICD-10-CM   1. 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  Return for new or worsening symptoms.       Vicky Charleston, PA-C 07/22/24 9972    Griselda Norris, MD 07/22/24 [phone]
